# Patient Record
Sex: Female | Born: 1970 | Race: White | Hispanic: No | Marital: Married | Smoking: Never smoker
Health system: Southern US, Community
[De-identification: ages and names within clinical notes are randomized; demographics above are authoritative.]

## PROBLEM LIST (undated history)

## (undated) DIAGNOSIS — M25559 Pain in unspecified hip: Secondary | ICD-10-CM

## (undated) DIAGNOSIS — M8920 Other disorders of bone development and growth, unspecified site: Secondary | ICD-10-CM

## (undated) DIAGNOSIS — N852 Hypertrophy of uterus: Secondary | ICD-10-CM

## (undated) DIAGNOSIS — E785 Hyperlipidemia, unspecified: Secondary | ICD-10-CM

## (undated) DIAGNOSIS — Z90710 Acquired absence of both cervix and uterus: Secondary | ICD-10-CM

## (undated) DIAGNOSIS — G43909 Migraine, unspecified, not intractable, without status migrainosus: Secondary | ICD-10-CM

## (undated) HISTORY — DX: Other disorders of bone development and growth, unspecified site: M89.20

## (undated) HISTORY — DX: Acquired absence of both cervix and uterus: Z90.710

## (undated) HISTORY — PX: APPENDECTOMY: SHX54

## (undated) HISTORY — DX: Hyperlipidemia, unspecified: E78.5

## (undated) HISTORY — PX: AUGMENTATION MAMMAPLASTY: SUR837

## (undated) HISTORY — DX: Migraine, unspecified, not intractable, without status migrainosus: G43.909

## (undated) HISTORY — PX: BREAST SURGERY: SHX581

## (undated) HISTORY — DX: Pain in unspecified hip: M25.559

## (undated) HISTORY — DX: Hypertrophy of uterus: N85.2

## (undated) HISTORY — PX: PLACEMENT OF BREAST IMPLANTS: SHX6334

---

## 2004-06-26 ENCOUNTER — Ambulatory Visit: Payer: Self-pay

## 2004-11-19 ENCOUNTER — Observation Stay: Payer: Self-pay | Admitting: Unknown Physician Specialty

## 2004-11-21 ENCOUNTER — Observation Stay: Payer: Self-pay

## 2004-11-24 ENCOUNTER — Observation Stay: Payer: Self-pay | Admitting: Unknown Physician Specialty

## 2004-12-01 ENCOUNTER — Observation Stay: Payer: Self-pay

## 2004-12-02 ENCOUNTER — Inpatient Hospital Stay: Payer: Self-pay

## 2006-08-10 ENCOUNTER — Emergency Department (HOSPITAL_COMMUNITY): Admission: EM | Admit: 2006-08-10 | Discharge: 2006-08-10 | Payer: Self-pay | Admitting: Emergency Medicine

## 2008-03-28 ENCOUNTER — Emergency Department: Payer: Self-pay | Admitting: Emergency Medicine

## 2008-03-30 ENCOUNTER — Emergency Department: Payer: Self-pay | Admitting: Internal Medicine

## 2008-10-10 ENCOUNTER — Emergency Department: Payer: Self-pay | Admitting: Emergency Medicine

## 2008-12-19 ENCOUNTER — Emergency Department: Payer: Self-pay | Admitting: Unknown Physician Specialty

## 2009-01-22 ENCOUNTER — Emergency Department: Payer: Self-pay | Admitting: Emergency Medicine

## 2010-01-12 ENCOUNTER — Inpatient Hospital Stay: Payer: Self-pay | Admitting: Internal Medicine

## 2010-06-10 ENCOUNTER — Emergency Department: Payer: Self-pay | Admitting: Emergency Medicine

## 2011-09-05 ENCOUNTER — Emergency Department: Payer: Self-pay | Admitting: Emergency Medicine

## 2012-10-28 LAB — HM PAP SMEAR

## 2013-01-28 ENCOUNTER — Emergency Department: Payer: Self-pay | Admitting: Unknown Physician Specialty

## 2013-01-28 LAB — CBC
Platelet: 224 10*3/uL (ref 150–440)
RBC: 3.85 10*6/uL (ref 3.80–5.20)
RDW: 13.1 % (ref 11.5–14.5)

## 2013-01-28 LAB — BASIC METABOLIC PANEL
Chloride: 109 mmol/L — ABNORMAL HIGH (ref 98–107)
EGFR (African American): >60
Osmolality: 280 (ref 275–301)
Potassium: 4.1 mmol/L (ref 3.5–5.1)
Sodium: 141 mmol/L (ref 136–145)

## 2013-04-18 ENCOUNTER — Emergency Department: Payer: Self-pay | Admitting: Emergency Medicine

## 2013-12-21 ENCOUNTER — Emergency Department: Payer: Self-pay | Admitting: Emergency Medicine

## 2014-08-26 ENCOUNTER — Emergency Department: Payer: Self-pay | Admitting: Internal Medicine

## 2014-08-26 LAB — URINALYSIS, COMPLETE
BILIRUBIN, UR: NEGATIVE
BLOOD: NEGATIVE
Bacteria: NONE SEEN
GLUCOSE, UR: NEGATIVE mg/dL (ref 0–75)
KETONE: NEGATIVE
LEUKOCYTE ESTERASE: NEGATIVE
Nitrite: NEGATIVE
Ph: 5 (ref 4.5–8.0)
Protein: NEGATIVE
RBC, UR: NONE SEEN /HPF (ref 0–5)
Specific Gravity: 1.003 (ref 1.003–1.030)
Squamous Epithelial: 8
WBC UR: NONE SEEN /HPF (ref 0–5)

## 2016-05-07 ENCOUNTER — Encounter: Payer: Self-pay | Admitting: Family Medicine

## 2016-05-07 ENCOUNTER — Ambulatory Visit (INDEPENDENT_AMBULATORY_CARE_PROVIDER_SITE_OTHER): Payer: Medicaid Other | Admitting: Family Medicine

## 2016-05-07 VITALS — BP 113/69 | HR 64 | Temp 98.7°F | Ht 66.0 in | Wt 142.0 lb

## 2016-05-07 DIAGNOSIS — J029 Acute pharyngitis, unspecified: Secondary | ICD-10-CM | POA: Diagnosis not present

## 2016-05-07 DIAGNOSIS — M25551 Pain in right hip: Secondary | ICD-10-CM

## 2016-05-07 DIAGNOSIS — M25552 Pain in left hip: Secondary | ICD-10-CM

## 2016-05-07 DIAGNOSIS — N946 Dysmenorrhea, unspecified: Secondary | ICD-10-CM

## 2016-05-07 MED ORDER — DICLOFENAC SODIUM 1 % TD GEL: 4.0000 g | Freq: Four times a day (QID) | TRANSDERMAL | 3 refills | Status: DC

## 2016-05-07 MED ORDER — LIDOCAINE VISCOUS 2 % MT SOLN: 5.0000 mL | OROMUCOSAL | 0 refills | Status: DC | PRN

## 2016-05-07 NOTE — Progress Notes (Signed)
   BP 113/69   Pulse 64   Temp 98.7 F (37.1 C)   Ht 5\' 6"  (1.676 m)   Wt 142 lb (64.4 kg)   LMP 04/23/2016 (Approximate)   SpO2 100%   BMI 22.92 kg/m    Subjective:    [name], female    DOB: 08/29/1970, 45 y.o.   MRN: [id number]  HPI: Beverly Haley is a 45 y.o. female  Chief Complaint  Patient presents with  . Sore Throat    x 2-3 days, worse at night, feels like it's been cut, hurts to swallow/talk/eat.    Patient presents with 3 day history of severe sore throat and painful swallowing. Has not been taking anything for symptoms.   Relevant past medical, surgical, family and social history reviewed and updated as indicated. Interim medical history since our last visit reviewed. Allergies and medications reviewed and updated.  Review of Systems  Per HPI unless specifically indicated above     Objective:    BP 113/69   Pulse 64   Temp 98.7 F (37.1 C)   Ht 5\' 6"  (1.676 m)   Wt 142 lb (64.4 kg)   LMP 04/23/2016 (Approximate)   SpO2 100%   BMI 22.92 kg/m   Wt Readings from Last 3 Encounters:  05/07/16 142 lb (64.4 kg)  08/11/14 135 lb (61.2 kg)    Physical Exam  Results for orders placed or performed in visit on 05/07/16  HM PAP SMEAR  Result Value Ref Range   HM Pap smear per PP       Assessment & Plan:   Problem List Items Addressed This Visit    None    Visit Diagnoses    Sore throat    -  Primary   Relevant Orders   Rapid strep screen (not at [facility])       Follow up plan: No Follow-up on file.

## 2016-05-07 NOTE — Patient Instructions (Signed)
Follow up as needed

## 2016-05-08 ENCOUNTER — Telehealth: Payer: Self-pay | Admitting: Family Medicine

## 2016-05-08 ENCOUNTER — Other Ambulatory Visit: Payer: Self-pay | Admitting: Family Medicine

## 2016-05-08 ENCOUNTER — Encounter: Payer: Self-pay | Admitting: Family Medicine

## 2016-05-08 MED ORDER — ONDANSETRON HCL 4 MG PO TABS: 4.0000 mg | ORAL_TABLET | Freq: Three times a day (TID) | ORAL | 0 refills | Status: DC | PRN

## 2016-05-08 NOTE — Telephone Encounter (Signed)
Routing to provider  

## 2016-05-08 NOTE — Telephone Encounter (Signed)
Responded via patient mychart message.

## 2016-05-08 NOTE — Telephone Encounter (Signed)
Pt husband also called stating his concerns about his wife. We gave him the same information we gave his wife and told him the results would be in in the morning.

## 2016-05-08 NOTE — Telephone Encounter (Signed)
Called pt back told her what rachel had said. She called her in a nausea medicine and advised her to tylenol or ibuprofen for the pain. That we would contact her with her lab results tomorrow.

## 2016-05-09 ENCOUNTER — Telehealth: Payer: Self-pay | Admitting: Family Medicine

## 2016-05-09 ENCOUNTER — Encounter: Payer: Self-pay | Admitting: Family Medicine

## 2016-05-10 LAB — RAPID STREP SCREEN (MED CTR MEBANE ONLY): Strep Gp A Ag, IA W/Reflex: NEGATIVE

## 2016-05-10 LAB — CULTURE, GROUP A STREP: STREP A CULTURE: NEGATIVE

## 2016-05-12 ENCOUNTER — Encounter: Payer: Self-pay | Admitting: Family Medicine

## 2016-05-15 ENCOUNTER — Ambulatory Visit (INDEPENDENT_AMBULATORY_CARE_PROVIDER_SITE_OTHER): Payer: Medicaid Other | Admitting: Obstetrics and Gynecology

## 2016-05-15 ENCOUNTER — Encounter: Payer: Self-pay | Admitting: Obstetrics and Gynecology

## 2016-05-15 VITALS — BP 116/53 | HR 76 | Ht 66.0 in | Wt 138.2 lb

## 2016-05-15 DIAGNOSIS — N946 Dysmenorrhea, unspecified: Secondary | ICD-10-CM

## 2016-05-15 DIAGNOSIS — N920 Excessive and frequent menstruation with regular cycle: Secondary | ICD-10-CM | POA: Diagnosis not present

## 2016-05-15 DIAGNOSIS — N852 Hypertrophy of uterus: Secondary | ICD-10-CM

## 2016-05-15 HISTORY — DX: Hypertrophy of uterus: N85.2

## 2016-05-15 NOTE — Patient Instructions (Signed)
1. Ultrasound is scheduled 2. Return in 1 week after ultrasound for follow-up  Levonorgestrel intrauterine device (IUD) What is this medicine? LEVONORGESTREL IUD (LEE voe nor jes trel) is a contraceptive (birth control) device. The device is placed inside the uterus by a healthcare professional. It is used to prevent pregnancy and can also be used to treat heavy bleeding that occurs during your period. Depending on the device, it can be used for 3 to 5 years. This medicine may be used for other purposes; ask your health care provider or pharmacist if you have questions. What should I tell my health care provider before I take this medicine? They need to know if you have any of these conditions: -abnormal Pap smear -cancer of the breast, uterus, or cervix -diabetes -endometritis -genital or pelvic infection now or in the past -have more than one sexual partner or your partner has more than one partner -heart disease -history of an ectopic or tubal pregnancy -immune system problems -IUD in place -liver disease or tumor -problems with blood clots or take blood-thinners -use intravenous drugs -uterus of unusual shape -vaginal bleeding that has not been explained -an unusual or allergic reaction to levonorgestrel, other hormones, silicone, or polyethylene, medicines, foods, dyes, or preservatives -pregnant or trying to get pregnant -breast-feeding How should I use this medicine? This device is placed inside the uterus by a health care professional. Talk to your pediatrician regarding the use of this medicine in children. Special care may be needed. Overdosage: If you think you have taken too much of this medicine contact a poison control center or emergency room at once. NOTE: This medicine is only for you. Do not share this medicine with others. What if I miss a dose? This does not apply. What may interact with this medicine? Do not take this medicine with any of the following  medications: -amprenavir -bosentan -fosamprenavir This medicine may also interact with the following medications: -aprepitant -barbiturate medicines for inducing sleep or treating seizures -bexarotene -griseofulvin -medicines to treat seizures like carbamazepine, ethotoin, felbamate, oxcarbazepine, phenytoin, topiramate -modafinil -pioglitazone -rifabutin -rifampin -rifapentine -some medicines to treat HIV infection like atazanavir, indinavir, lopinavir, nelfinavir, tipranavir, ritonavir -St. John's wort -warfarin This list may not describe all possible interactions. Give your health care provider a list of all the medicines, herbs, non-prescription drugs, or dietary supplements you use. Also tell them if you smoke, drink alcohol, or use illegal drugs. Some items may interact with your medicine. What should I watch for while using this medicine? Visit your doctor or health care professional for regular check ups. See your doctor if you or your partner has sexual contact with others, becomes HIV positive, or gets a sexual transmitted disease. This product does not protect you against HIV infection (AIDS) or other sexually transmitted diseases. You can check the placement of the IUD yourself by reaching up to the top of your vagina with clean fingers to feel the threads. Do not pull on the threads. It is a good habit to check placement after each menstrual period. Call your doctor right away if you feel more of the IUD than just the threads or if you cannot feel the threads at all. The IUD may come out by itself. You may become pregnant if the device comes out. If you notice that the IUD has come out use a backup birth control method like condoms and call your health care provider. Using tampons will not change the position of the IUD and are okay to  use during your period. What side effects may I notice from receiving this medicine? Side effects that you should report to your doctor or  health care professional as soon as possible: -allergic reactions like skin rash, itching or hives, swelling of the face, lips, or tongue -fever, flu-like symptoms -genital sores -high blood pressure -no menstrual period for 6 weeks during use -pain, swelling, warmth in the leg -pelvic pain or tenderness -severe or sudden headache -signs of pregnancy -stomach cramping -sudden shortness of breath -trouble with balance, talking, or walking -unusual vaginal bleeding, discharge -yellowing of the eyes or skin Side effects that usually do not require medical attention (report to your doctor or health care professional if they continue or are bothersome): -acne -breast pain -change in sex drive or performance -changes in weight -cramping, dizziness, or faintness while the device is being inserted -headache -irregular menstrual bleeding within first 3 to 6 months of use -nausea This list may not describe all possible side effects. Call your doctor for medical advice about side effects. You may report side effects to FDA at 1-800-FDA-1088. Where should I keep my medicine? This does not apply. NOTE: This sheet is a summary. It may not cover all possible information. If you have questions about this medicine, talk to your doctor, pharmacist, or health care provider.    2016, Elsevier/Gold Standard. (2011-08-21 13:54:04)     Endometriosis Endometriosis is a condition in which the tissue that lines the uterus (endometrium) grows outside of its normal location. The tissue may grow in many locations close to the uterus, but it commonly grows on the ovaries, fallopian tubes, vagina, or bowel. Because the uterus expels, or sheds, its lining every menstrual cycle, there is bleeding wherever the endometrial tissue is located. This can cause pain because blood is irritating to tissues not normally exposed to it.  CAUSES  The cause of endometriosis is not known.  SIGNS AND SYMPTOMS  Often, there  are no symptoms. When symptoms are present, they can vary with the location of the displaced tissue. Various symptoms can occur at different times. Although symptoms occur mainly during a woman's menstrual period, they can also occur midcycle and usually stop with menopause. Some people may go months with no symptoms at all. Symptoms may include:   Back or abdominal pain.   Heavier bleeding during periods.   Pain during intercourse.   Painful bowel movements.   Infertility. DIAGNOSIS  Your health care provider will do a physical exam and ask about your symptoms. Various tests may be done, such as:   Blood tests and urine tests. These are done to help rule out other problems.   Ultrasound. This test is done to look for abnormal tissue.   An X-ray of the lower bowel (barium enema).  Laparoscopy. In this procedure, a thin, lighted tube with a tiny camera on the end (laparoscope) is inserted into your abdomen. This helps your health care provider look for abnormal tissue to confirm the diagnosis. The health care provider may also remove a small piece of tissue (biopsy) from any abnormal tissue found. This tissue sample can then be sent to a lab so it can be looked at under a microscope. TREATMENT  Treatment will vary and may include:   Medicines to relieve pain. Nonsteroidal anti-inflammatory drugs (NSAIDs) are a type of pain medicine that can help to relieve the pain caused by endometriosis.  Hormonal therapy. When using hormonal therapy, periods are eliminated. This eliminates the monthly exposure to blood  by the displaced endometrial tissue.   Surgery. Surgery may sometimes be done to remove the abnormal endometrial tissue. In severe cases, surgery may be done to remove the fallopian tubes, uterus, and ovaries (hysterectomy). HOME CARE INSTRUCTIONS   Take all medicines as directed by your health care provider. Do not take aspirin because it may increase bleeding when you are  not on hormonal therapy.   Avoid activities that produce pain, including sexual activity. SEEK MEDICAL CARE IF:  You have pelvic pain before, after, or during your periods.  You have pelvic pain between periods that gets worse during your period.  You have pelvic pain during or after sex.  You have pelvic pain with bowel movements or urination, especially during your period.  You have problems getting pregnant.  You have a fever. SEEK IMMEDIATE MEDICAL CARE IF:   Your pain is severe and is not responding to pain medicine.   You have severe nausea and vomiting, or you cannot keep foods down.   You have pain that is limited to the right lower part of your abdomen.   You have swelling or increasing pain in your abdomen.   You see blood in your stool.  MAKE SURE YOU:   Understand these instructions.  Will watch your condition.  Will get help right away if you are not doing well or get worse.   This information is not intended to replace advice given to you by your health care provider. Make sure you discuss any questions you have with your health care provider.   Document Released: 07/18/2000 Document Revised: 08/11/2014 Document Reviewed: 03/18/2013 Elsevier Interactive Patient Education 2016 Elsevier Inc   . Diagnostic Laparoscopy A diagnostic laparoscopy is a procedure to diagnose diseases in the abdomen. During the procedure, a thin, lighted, pencil-sized instrument called a laparoscope is inserted into the abdomen through an incision. The laparoscope allows your health care provider to look at the organs inside your body. LET [facility] CARE PROVIDER KNOW ABOUT:  Any allergies you have.  All medicines you are taking, including vitamins, herbs, eye drops, creams, and over-the-counter medicines.  Previous problems you or members of your family have had with the use of anesthetics.  Any blood disorders you have.  Previous surgeries you have had.  Medical  conditions you have. RISKS AND COMPLICATIONS  Generally, this is a safe procedure. However, problems can occur, which may include:  Infection.  Bleeding.  Damage to other organs.  Allergic reaction to the anesthetics used during the procedure. BEFORE THE PROCEDURE  Do not eat or drink anything after midnight on the night before the procedure or as directed by your health care provider.  Ask your health care provider about:  Changing or stopping your regular medicines.  Taking medicines such as aspirin and ibuprofen. These medicines can thin your blood. Do not take these medicines before your procedure if your health care provider instructs you not to.  Plan to have someone take you home after the procedure. PROCEDURE  You may be given a medicine to help you relax (sedative).  You will be given a medicine to make you sleep (general anesthetic).  Your abdomen will be inflated with a gas. This will make your organs easier to see.  Small incisions will be made in your abdomen.  A laparoscope and other small instruments will be inserted into the abdomen through the incisions.  A tissue sample may be removed from an organ in the abdomen for examination.  The instruments  will be removed from the abdomen.  The gas will be released.  The incisions will be closed with stitches (sutures). AFTER THE PROCEDURE  Your blood pressure, heart rate, breathing rate, and blood oxygen level will be monitored often until the medicines you were given have worn off.   This information is not intended to replace advice given to you by your health care provider. Make sure you discuss any questions you have with your health care provider.   Document Released: 10/27/2000 Document Revised: 04/11/2015 Document Reviewed: 03/03/2014 Elsevier Interactive Patient Education [employer]  3.  Information on laparoscopy, endometriosis and Mirena IUD is given

## 2016-05-15 NOTE — Progress Notes (Signed)
GYN ENCOUNTER NOTE  Subjective:       Beverly Haley is a 45 y.o. [id number] female is here for gynecologic evaluation of the following issues:  1. Menorrhagia 2. Dysmenorrhea  45 year old married white female para 3003, using vasectomy for contraception, [facility] in [facility] of menorrhagia. Patient is interested in possible endometrial ablation  Past obstetric history: Para 3003, spontaneous vaginal delivery 3, largest infant 8 lbs. 9 oz.  Past gynecologic history:  Menarche: Age 45 Intervals: Monthly Duration: 7-8 days, heavy Lifelong history of severe dysmenorrhea requiring Midol, nonsteroidal anti-inflammatory medication and heating pad to help with pain management; has missed school in the past because of severe cramps Dysmenorrhea is notable for midline and bilateral adnexal cramping right greater than left, associated with low back pain with radiation into the buttocks and thighs; birth control pills never really helped her condition when she was younger; Depo-Provera help the cramps somewhat but did not cause amenorrhea; patient denies deep thrusting dyspareunia Several sisters also with history of severe menstrual cramps without a diagnosis of endometriosis    Bowel function is normal although she does experience some irritability around her menses Bladder function is normal although she does note some urinary frequency and urgency around her menses   Gynecologic History Patient's last menstrual period was 04/23/2016 (approximate). Contraception: vasectomy Last Pap: 2014 negative  Obstetric History OB History  Gravida Para Term Preterm AB Living  3 3 3     3   SAB TAB Ectopic Multiple Live Births          3    # Outcome Date GA Lbr Len/2nd Weight Sex Delivery Anes PTL Lv  3 Term 2006   7 lb 9.6 oz (3.447 kg) F Vag-Spont   LIV  2 Term 1999   8 lb 14.4 oz (4.037 kg) M Vag-Spont   LIV  1 Term 1995   7 lb 3.2 oz (3.266 kg) F  Vag-Spont   LIV      Past Medical History:  Diagnosis Date  . Hip pain   . Hyperlipidemia   . Migraine   . 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 APPENDECTOMY    . PLACEMENT OF BREAST IMPLANTS      Current Outpatient Prescriptions on File Prior to Visit  Medication Sig Dispense Refill  . diclofenac sodium (VOLTAREN) 1 % GEL Apply 4 g topically 4 (four) times daily. 1 Tube 3   No current facility-administered medications on file prior to visit.     No Known Allergies  Social History   Social History  . Marital status: Married    Spouse name: N/A  . Number of children: N/A  . Years of education: N/A   Occupational History  . Not on file.   Social History Main Topics  . Smoking status: Never Smoker  . Smokeless tobacco: Never Used  . Alcohol use 21.0 oz/week    35 Cans of beer per week     Comment: 6-7 beers a night  . Drug use: No  . Sexual activity: Yes    Birth control/ protection: Surgical     Comment: Spouse had vasectomy   Other Topics Concern  . Not on file   Social History Narrative  . No narrative on file    Family History  Problem Relation Age of Onset  .  Migraines Mother   . Hyperlipidemia Father   . Hypertension Father   . Breast cancer Neg Hx   . Ovarian cancer Neg Hx   . Colon cancer Neg Hx   . Diabetes Neg Hx     The following portions of the patient's history were reviewed and updated as appropriate: allergies, current medications, past family history, past medical history, past social history, past surgical history and problem list.  Review of Systems Review of Systems -per history of present illness Objective:   BP (!) 116/53   Pulse 76   Ht 5\' 6"  (1.676 m)   Wt 138 lb 3.2 oz (62.7 kg)   LMP 04/23/2016 (Approximate)   BMI 22.31 kg/m  CONSTITUTIONAL: Well-developed, well-nourished female in no acute distress.  HENT:   Normocephalic, atraumatic.  NECK: Normal range of motion, supple, no masses.  Normal thyroid.  SKIN: Skin is warm and dry. No rash noted. Not diaphoretic. No erythema. No pallor. NEUROLGIC: Alert and oriented to person, place, and time. PSYCHIATRIC: Normal mood and affect. Normal behavior. Normal judgment and thought content. CARDIOVASCULAR:Not Examined RESPIRATORY: Not Examined BREASTS: Not Examined Back: No CVA tenderness ABDOMEN: Soft, non distended; Non tender.  No Organomegaly. PELVIC:  External Genitalia: Normal  BUS: Normal  Vagina: Normal  Cervix: Normal; parous; 1/4 cervical motion tenderness  Uterus: Globular 10 week size, retroverted, 2/4 tender, shape,consistency, mobile  Adnexa: Nonpalpable; mild tenderness bilaterally 1/4  RV: Normal external exam  Bladder: Nontender MUSCULOSKELETAL: Normal range of motion. No tenderness.  No cyanosis, clubbing, or edema.     Assessment:   1. Menorrhagia with regular cycle - US Pelvis Complete; Future - US Transvaginal Non-OB; Future  2. Dysmenorrhea - US Pelvis Complete; Future - US Transvaginal Non-OB; Future  3. Bulky or enlarged uterus - US Pelvis Complete; Future - US Transvaginal Non-OB; Future  Do not feel that endometrial ablation is the best option for this patient. Muscular consider endometriosis as the underlying problem. If present, we will likely recommend hysterectomy (vaginal) or laparoscopic-assisted  Plan:   1. Pelvic ultrasound 2. Return in 2 weeks for follow-up and further management planning 3. Consider laparoscopy with peritoneal biopsies to confirm endometriosis 4. Consider Mirena IUD is a nonsurgical option to try to manage her heavy menses and dysmenorrhea  Herold Harms, MD  Note: This dictation was prepared with Dragon dictation along with smaller phrase technology. Any transcriptional errors that result from this process are unintentional.

## 2016-05-22 ENCOUNTER — Ambulatory Visit (INDEPENDENT_AMBULATORY_CARE_PROVIDER_SITE_OTHER): Payer: Medicaid Other

## 2016-05-22 DIAGNOSIS — N852 Hypertrophy of uterus: Secondary | ICD-10-CM

## 2016-05-22 DIAGNOSIS — N946 Dysmenorrhea, unspecified: Secondary | ICD-10-CM | POA: Diagnosis not present

## 2016-05-22 DIAGNOSIS — N920 Excessive and frequent menstruation with regular cycle: Secondary | ICD-10-CM

## 2016-05-29 ENCOUNTER — Ambulatory Visit (INDEPENDENT_AMBULATORY_CARE_PROVIDER_SITE_OTHER): Payer: Medicaid Other | Admitting: Obstetrics and Gynecology

## 2016-05-29 VITALS — BP 113/58 | HR 72 | Ht 66.0 in | Wt 138.4 lb

## 2016-05-29 DIAGNOSIS — N852 Hypertrophy of uterus: Secondary | ICD-10-CM | POA: Diagnosis not present

## 2016-05-29 DIAGNOSIS — N920 Excessive and frequent menstruation with regular cycle: Secondary | ICD-10-CM | POA: Diagnosis not present

## 2016-05-29 DIAGNOSIS — N946 Dysmenorrhea, unspecified: Secondary | ICD-10-CM | POA: Diagnosis not present

## 2016-05-29 NOTE — Patient Instructions (Signed)
1. Laparoscopy with peritoneal biopsies scheduled 2. If everything is totally normal on laparoscopy, we will also proceed with hysteroscopy/D&C with NovaSure endometrial ablation 3. Return for preoperative appointment the week before surgery  Diagnostic Laparoscopy A diagnostic laparoscopy is a procedure to diagnose diseases in the abdomen. During the procedure, a thin, lighted, pencil-sized instrument called a laparoscope is inserted into the abdomen through an incision. The laparoscope allows your health care provider to look at the organs inside your body. LET [facility] CARE PROVIDER KNOW ABOUT:  Any allergies you have.  All medicines you are taking, including vitamins, herbs, eye drops, creams, and over-the-counter medicines.  Previous problems you or members of your family have had with the use of anesthetics.  Any blood disorders you have.  Previous surgeries you have had.  Medical conditions you have. RISKS AND COMPLICATIONS  Generally, this is a safe procedure. However, problems can occur, which may include:  Infection.  Bleeding.  Damage to other organs.  Allergic reaction to the anesthetics used during the procedure. BEFORE THE PROCEDURE  Do not eat or drink anything after midnight on the night before the procedure or as directed by your health care provider.  Ask your health care provider about:  Changing or stopping your regular medicines.  Taking medicines such as aspirin and ibuprofen. These medicines can thin your blood. Do not take these medicines before your procedure if your health care provider instructs you not to.  Plan to have someone take you home after the procedure. PROCEDURE  You may be given a medicine to help you relax (sedative).  You will be given a medicine to make you sleep (general anesthetic).  Your abdomen will be inflated with a gas. This will make your organs easier to see.  Small incisions will be made in your abdomen.  A  laparoscope and other small instruments will be inserted into the abdomen through the incisions.  A tissue sample may be removed from an organ in the abdomen for examination.  The instruments will be removed from the abdomen.  The gas will be released.  The incisions will be closed with stitches (sutures). AFTER THE PROCEDURE  Your blood pressure, heart rate, breathing rate, and blood oxygen level will be monitored often until the medicines you were given have worn off.   This information is not intended to replace advice given to you by your health care provider. Make sure you discuss any questions you have with your health care provider.   Document Released: 10/27/2000 Document Revised: 04/11/2015 Document Reviewed: 03/03/2014 Elsevier Interactive Patient Education 2016 Elsevier Inc.    Endometrial Ablation Endometrial ablation removes the lining of the uterus (endometrium). It is usually a same-day, outpatient treatment. Ablation helps avoid major surgery, such as surgery to remove the cervix and uterus (hysterectomy). After endometrial ablation, you will have little or no menstrual bleeding and may not be able to have children. However, if you are premenopausal, you will need to use a reliable method of birth control following the procedure because of the small chance that pregnancy can occur. There are different reasons to have this procedure. These reasons include:  Heavy periods.  Bleeding that is causing anemia.  Irregular bleeding.  Bleeding fibroids on the lining inside the uterus if they are smaller than 3 centimeters. This procedure may not be possible for you if:   You want to have children in the future.   You have severe cramps with your menstrual period.   You have  precancerous or cancerous cells in your uterus.   You were recently pregnant.   You have gone through menopause.   You have had major surgery on your uterus, resulting in thinning of the  uterine wall. Surgeries may include:  The removal of one or more uterine fibroids (myomectomy).  A cesarean section with a classic (vertical) incision on your uterus. Ask your health care provider what type of cesarean you had. Sometimes the scar on your skin is different than the scar on your uterus. Even if you have had surgery on your uterus, certain types of ablation may still be safe for you. Talk with your health care provider. LET [facility] PROVIDER KNOW ABOUT:  Any allergies you have.  All medicines you are taking, including vitamins, herbs, eye drops, creams, and over-the-counter medicines.  Previous problems you or members of your family have had with the use of anesthetics.  Any blood disorders you have.  Previous surgeries you have had.  Medical conditions you have. RISKS AND COMPLICATIONS  Generally, this is a safe procedure. However, as with any procedure, complications can occur. Possible complications include:  Perforation of the uterus.  Bleeding.  Infection of the uterus, bladder, or vagina.  Injury to surrounding organs.  An air bubble to the lung (air embolus).  Pregnancy following the procedure.  Failure of the procedure to help the problem, requiring hysterectomy.  Decreased ability to diagnose cancer in the lining of the uterus. BEFORE THE PROCEDURE  The lining of the uterus must be tested to make sure there is no pre-cancerous or cancer cells present.  An ultrasound may be performed to look at the size of the uterus and to check for abnormalities.  Medicines may be given to thin the lining of the uterus. PROCEDURE  During the procedure, your health care provider will use a tool called a resectoscope to help see inside your uterus. There are different ways to remove the lining of your uterus.   Radiofrequency - This method uses a radiofrequency-alternating electric current to remove the lining of the uterus.  Cryotherapy - This method  uses extreme cold to freeze the lining of the uterus.  Heated-Free Liquid - This method uses heated salt (saline) solution to remove the lining of the uterus.  Microwave - This method uses high-energy microwaves to heat up the lining of the uterus to remove it.  Thermal balloon - This method involves inserting a catheter with a balloon tip into the uterus. The balloon tip is filled with heated fluid to remove the lining of the uterus. AFTER THE PROCEDURE  After your procedure, do not have sexual intercourse or insert anything into your vagina until permitted by your health care provider. After the procedure, you may experience:  Cramps.  Vaginal discharge.  Frequent urination.   This information is not intended to replace advice given to you by your health care provider. Make sure you discuss any questions you have with your health care provider.   Document Released: 05/30/2004 Document Revised: 04/11/2015 Document Reviewed: 12/22/2012 Elsevier Interactive Patient Education [employer]2016 Elsevier Inc.

## 2016-05-29 NOTE — Progress Notes (Signed)
Chief complaint: 1. Menorrhagia 2. Dysmenorrhea 3. Enlarged uterus  Patient presents for follow-up after ultrasound for further management planning. Ultrasound is normal.  Options of management were reviewed. Patient understands that I recommend laparoscopy with biopsies to rule out endometriosis before we consider performing endometrial ablation.  OBJECTIVE: BP (!) 113/58   Pulse 72   Ht 5\' 6"  (1.676 m)   Wt 138 lb 6.4 oz (62.8 kg)   LMP 05/26/2016 (Exact Date)   BMI 22.34 kg/m  Physical exam-deferred  ASSESSMENT: 1. Menorrhagia with regular cycles 2. Severe dysmenorrhea 3. Normal pelvic ultrasound with normal uterine measurements 4. Dispense endometriosis/adenomyosis as being the etiology to patient's symptoms; patient understands that if she has endometriosis, the best option for management would be hysterectomy rather than endometrial ablation 5. If laparoscopy is totally normal, we will then proceed at the same time to perform hysteroscopy/D&C with NovaSure endometrial ablation. If there are abnormal findings at laparoscopy, ablation will not be performed  A total of 15 minutes were spent face-to-face with the patient during this encounter and over half of that time dealt with counseling and coordination of care.  Herold HarmsMartin A Crews Mccollam, MD  Note: This dictation was prepared with Dragon dictation along with smaller phrase technology. Any transcriptional errors that result from this process are unintentional.

## 2016-06-12 ENCOUNTER — Encounter: Payer: Self-pay | Admitting: Obstetrics and Gynecology

## 2016-06-12 ENCOUNTER — Ambulatory Visit (INDEPENDENT_AMBULATORY_CARE_PROVIDER_SITE_OTHER): Payer: Medicaid Other | Admitting: Obstetrics and Gynecology

## 2016-06-12 ENCOUNTER — Encounter
Admission: RE | Admit: 2016-06-12 | Discharge: 2016-06-12 | Disposition: A | Discharge status: Home / Self Care | Payer: Medicaid Other | Source: Ambulatory Visit | Attending: Obstetrics and Gynecology | Admitting: Obstetrics and Gynecology

## 2016-06-12 VITALS — BP 94/63 | HR 69 | Wt 137.0 lb

## 2016-06-12 DIAGNOSIS — Z01812 Encounter for preprocedural laboratory examination: Secondary | ICD-10-CM | POA: Insufficient documentation

## 2016-06-12 DIAGNOSIS — Z01818 Encounter for other preprocedural examination: Secondary | ICD-10-CM

## 2016-06-12 DIAGNOSIS — N920 Excessive and frequent menstruation with regular cycle: Secondary | ICD-10-CM

## 2016-06-12 DIAGNOSIS — N946 Dysmenorrhea, unspecified: Secondary | ICD-10-CM

## 2016-06-12 DIAGNOSIS — N852 Hypertrophy of uterus: Secondary | ICD-10-CM

## 2016-06-12 LAB — CBC WITH DIFFERENTIAL/PLATELET
BASOS ABS: 0 10*3/uL (ref 0–0.1)
BASOS PCT: 0 %
EOS PCT: 3 %
Eosinophils Absolute: 0.1 10*3/uL (ref 0–0.7)
HEMATOCRIT: 37.2 % (ref 35.0–47.0)
Hemoglobin: 12.4 g/dL (ref 12.0–16.0)
LYMPHS PCT: 22 %
Lymphs Abs: 1.3 10*3/uL (ref 1.0–3.6)
MCH: 28.4 pg (ref 26.0–34.0)
MCHC: 33.3 g/dL (ref 32.0–36.0)
MCV: 85.3 fL (ref 80.0–100.0)
Monocytes Absolute: 0.4 10*3/uL (ref 0.2–0.9)
Monocytes Relative: 7 %
NEUTROS ABS: 3.9 10*3/uL (ref 1.4–6.5)
Neutrophils Relative %: 68 %
PLATELETS: 252 10*3/uL (ref 150–440)
RBC: 4.36 MIL/uL (ref 3.80–5.20)
RDW: 15.3 % — ABNORMAL HIGH (ref 11.5–14.5)
WBC: 5.8 10*3/uL (ref 3.6–11.0)

## 2016-06-12 LAB — TYPE AND SCREEN
ABO/RH(D): O POS
Antibody Screen: NEGATIVE

## 2016-06-12 LAB — RAPID HIV SCREEN (HIV 1/2 AB+AG)
HIV 1/2 ANTIBODIES: NONREACTIVE
HIV-1 P24 Antigen - HIV24: NONREACTIVE

## 2016-06-12 NOTE — Progress Notes (Signed)
Subjective: PREOPERATIVE HISTORY AND PHYSICAL      Date of surgery: 06/16/2016 Seizure: 1. Laparoscopy with peritoneal biopsies 2. Hysteroscopy/D&C with NovaSure endometrial ablation    Patient is a 45 y.o. G3P3003female scheduled for laparoscopy with peritoneal biopsies to rule out possible endometriosis. If no pathology is identified, patient will also have hysteroscopy/D&C with NovaSure endometrial ablation.   Past obstetric history: Para 3003, spontaneous vaginal delivery 3, largest infant 8 lbs. 9 oz.  Past gynecologic history:  Menarche: Age 12 Intervals: Monthly Duration: 7-8 days, heavy Lifelong history of severe dysmenorrhea requiring Midol, nonsteroidal anti-inflammatory medication and heating pad to help with pain management; has missed school in the past because of severe cramps Dysmenorrhea is notable for midline and bilateral adnexal cramping right greater than left, associated with low back pain with radiation into the buttocks and thighs; birth control pills never really helped her condition when she was younger; Depo-Provera help the cramps somewhat but did not cause amenorrhea; patient denies deep thrusting dyspareunia Several sisters also with history of severe menstrual cramps without a diagnosis of endometriosis    Bowel function is normal although she does experience some irritability around her menses Bladder function is normal although she does note some urinary frequency and urgency around her menses   Gynecologic History Patient's last menstrual period was 04/23/2016 (approximate). Contraception: vasectomy Last Pap: 2014 negative   Menstrual History: OB History    Gravida Para Term Preterm AB Living   3 3 3     3   SAB TAB Ectopic Multiple Live Births           3      Patient's last menstrual period was 05/26/2016 (exact date).    Past Medical History:  Diagnosis Date  . Hip pain   . Hyperlipidemia   . Migraine   . 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 APPENDECTOMY    . PLACEMENT OF BREAST IMPLANTS      OB History  Gravida Para Term Preterm AB Living  3 3 3     3  SAB TAB Ectopic Multiple Live Births          3    # Outcome Date GA Lbr Len/2nd Weight Sex Delivery Anes PTL Lv  3 Term 2006   7 lb 9.6 oz (3.447 kg) F Vag-Spont   LIV  2 Term 1999   8 lb 14.4 oz (4.037 kg) M Vag-Spont   LIV  1 Term 1995   7 lb 3.2 oz (3.266 kg) F Vag-Spont   LIV      Social History   Social History  . Marital status: Married    Spouse name: N/A  . Number of children: N/A  . Years of education: N/A   Social History Main Topics  . Smoking status: Never Smoker  . Smokeless tobacco: Never Used  . Alcohol use 21.0 oz/week    35 Cans of beer per week     Comment: 6-7 beers a night  . Drug use: No  . Sexual activity: Yes    Birth control/ protection: Surgical     Comment: Spouse had vasectomy   Other Topics Concern  . None   Social History Narrative  . None    Family History  Problem Relation Age of Onset  . Migraines Mother   . Hyperlipidemia Father   .   Hypertension Father   . Breast cancer Neg Hx   . Ovarian cancer Neg Hx   . Colon cancer Neg Hx   . Diabetes Neg Hx      (Not in a hospital admission)  No Known Allergies  Review of Systems Constitutional: No recent fever/chills/sweats Respiratory: No recent cough/bronchitis Cardiovascular: No chest pain Gastrointestinal: No recent nausea/vomiting/diarrhea Genitourinary: No UTI symptoms Hematologic/lymphatic:No history of coagulopathy or recent blood thinner use    Objective:    BP 94/63 (BP Location: Left Arm, Patient Position: Sitting, Cuff Size: Normal)   Pulse 69   Wt 137 lb (62.1 kg)   LMP 05/26/2016 (Exact Date)   BMI 22.11 kg/m   General:   Normal  Skin:   normal  HEENT:  Normal  Neck:  Supple without Adenopathy or Thyromegaly  Lungs:    Heart:              Breasts:   Abdomen:  Pelvis:  M/S   Extremeties:  Neuro:    clear to auscultation bilaterally   Normal without murmur   Not Examined   soft, non-tender; bowel sounds normal; no masses,  no organomegaly   Exam deferred to OR  No CVAT  Warm/Dry   Normal       05/15/2016 PELVIC:             External Genitalia: Normal             BUS: Normal             Vagina: Normal             Cervix: Normal; parous; 1/4 cervical motion tenderness             Uterus: Globular 10 week size, retroverted, 2/4 tender, shape,consistency, mobile             Adnexa: Nonpalpable; mild tenderness bilaterally 1/4             RV: Normal external exam             Bladder: Nontender  Assessment:     1. Menorrhagia with regular cycles 2. Severe dysmenorrhea 3. Normal pelvic ultrasound with normal uterine measurements 4. Endometriosis/adenomyosis may be the etiology to patient's symptoms; patient understands that if she has endometriosis, the best option for management would be hysterectomy rather than endometrial ablation 5. If laparoscopy is totally normal, we will then proceed at the same time to perform hysteroscopy/D&C with NovaSure endometrial ablation. If there are abnormal findings at laparoscopy, ablation will not be performed Plan:  1. Laparoscopy with peritoneal biopsies If laparoscopy is normal, we will then proceed with 2. Hysteroscopy/D&C with NovaSure endometrial ablation  Preop counseling: Patient is to undergo laparoscopy with peritoneal biopsies to rule out endometriosis. Should normal pelvic findings be identified, she will then undergo hysteroscopy/D&C with NovaSure endometrial ablation. The patient is understanding of the planned procedures and is aware of and is accepting of all risks which include but are not limited to bleeding, infection, pelvic organ injury with need for repair, blood clot disorders, anesthesia risks, etc. All questions have been answered.  Informed consent is given. Patient is ready and willing to proceed with surgery as scheduled.  Beverly Reaves A Sibley Rolison, MD  Note: This dictation was prepared with Dragon dictation along with smaller phrase technology. Any transcriptional errors that result from this process are unintentional.  

## 2016-06-12 NOTE — Patient Instructions (Signed)
1. Return in 1 week after surgery for postop check 

## 2016-06-12 NOTE — Patient Instructions (Signed)
  Your procedure is scheduled on: June 16, 2016 (Monday) Report to Same Day Surgery 2nd floor [facility] To find out your arrival time please call [phone] between 1PM - 3PM on June 13, 2016 (Friday)  Remember: Instructions that are not followed completely may result in serious medical risk, up to and including death, or upon the discretion of your surgeon and anesthesiologist your surgery may need to be rescheduled.    _x___ 1. Do not eat food or drink liquids after midnight. No gum chewing or hard candies.     __x__ 2. No Alcohol for 24 hours before or after surgery.   __x__3. No Smoking for 24 prior to surgery.   ____  4. Bring all medications with you on the day of surgery if instructed.    __x__ 5. Notify your doctor if there is any change in your medical condition     (cold, fever, infections).     Do not wear jewelry, make-up, hairpins, clips or nail polish.  Do not wear lotions, powders, or perfumes. You may wear deodorant.  Do not shave 48 hours prior to surgery. Men may shave face and neck.  Do not bring valuables to the hospital.    [facility] for any belongings or valuables.               Contacts, dentures or bridgework may not be worn into surgery.  Leave your suitcase in the car. After surgery it may be brought to your room.  For patients admitted to the hospital, discharge time is determined by your treatment team.   Patients discharged the day of surgery will not be allowed to drive home.    Please read over the following fact sheets that you were given:   [facility] Preparing for Surgery and or MRSA Information   __ Take these medicines the morning of surgery with A SIP OF WATER:    1.   2.  3.  4.  5.  6.  ____Fleets enema or Magnesium Citrate as directed.   _x___ Use CHG Soap or sage wipes as directed on instruction sheet   ____ Use inhalers on the day of surgery and bring to hospital day of surgery  ____ Stop  metformin 2 days prior to surgery    ____ Take 1/2 of usual insulin dose the night before surgery and none on the morning of           surgery.   _x__ Stop aspirin or coumadin, or plavix (NO ASPIRIN)  x__ Stop Anti-inflammatories such as Advil, Aleve, Ibuprofen, Motrin, Naproxen,          Naprosyn, Goodies powders or aspirin products. Ok to take Tylenol.   ____ Stop supplements until after surgery.    ____ Bring C-Pap to the hospital.

## 2016-06-12 NOTE — H&P (Signed)
Subjective: PREOPERATIVE HISTORY AND PHYSICAL      Date of surgery: 06/16/2016 Seizure: 1. Laparoscopy with peritoneal biopsies 2. Hysteroscopy/D&C with NovaSure endometrial ablation    Patient is a 45 y.o. G3P3003female scheduled for laparoscopy with peritoneal biopsies to rule out possible endometriosis. If no pathology is identified, patient will also have hysteroscopy/D&C with NovaSure endometrial ablation.   Past obstetric history: Para 3003, spontaneous vaginal delivery 3, largest infant 8 lbs. 9 oz.  Past gynecologic history:  Menarche: Age 12 Intervals: Monthly Duration: 7-8 days, heavy Lifelong history of severe dysmenorrhea requiring Midol, nonsteroidal anti-inflammatory medication and heating pad to help with pain management; has missed school in the past because of severe cramps Dysmenorrhea is notable for midline and bilateral adnexal cramping right greater than left, associated with low back pain with radiation into the buttocks and thighs; birth control pills never really helped her condition when she was younger; Depo-Provera help the cramps somewhat but did not cause amenorrhea; patient denies deep thrusting dyspareunia Several sisters also with history of severe menstrual cramps without a diagnosis of endometriosis    Bowel function is normal although she does experience some irritability around her menses Bladder function is normal although she does note some urinary frequency and urgency around her menses   Gynecologic History Patient's last menstrual period was 04/23/2016 (approximate). Contraception: vasectomy Last Pap: 2014 negative   Menstrual History: OB History    Gravida Para Term Preterm AB Living   3 3 3     3   SAB TAB Ectopic Multiple Live Births           3      Patient's last menstrual period was 05/26/2016 (exact date).    Past Medical History:  Diagnosis Date  . Hip pain   . Hyperlipidemia   . Migraine   . 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 APPENDECTOMY    . PLACEMENT OF BREAST IMPLANTS      OB History  Gravida Para Term Preterm AB Living  3 3 3     3  SAB TAB Ectopic Multiple Live Births          3    # Outcome Date GA Lbr Len/2nd Weight Sex Delivery Anes PTL Lv  3 Term 2006   7 lb 9.6 oz (3.447 kg) F Vag-Spont   LIV  2 Term 1999   8 lb 14.4 oz (4.037 kg) M Vag-Spont   LIV  1 Term 1995   7 lb 3.2 oz (3.266 kg) F Vag-Spont   LIV      Social History   Social History  . Marital status: Married    Spouse name: N/A  . Number of children: N/A  . Years of education: N/A   Social History Main Topics  . Smoking status: Never Smoker  . Smokeless tobacco: Never Used  . Alcohol use 21.0 oz/week    35 Cans of beer per week     Comment: 6-7 beers a night  . Drug use: No  . Sexual activity: Yes    Birth control/ protection: Surgical     Comment: Spouse had vasectomy   Other Topics Concern  . None   Social History Narrative  . None    Family History  Problem Relation Age of Onset  . Migraines Mother   . Hyperlipidemia Father   .   Hypertension Father   . Breast cancer Neg Hx   . Ovarian cancer Neg Hx   . Colon cancer Neg Hx   . Diabetes Neg Hx      (Not in a hospital admission)  No Known Allergies  Review of Systems Constitutional: No recent fever/chills/sweats Respiratory: No recent cough/bronchitis Cardiovascular: No chest pain Gastrointestinal: No recent nausea/vomiting/diarrhea Genitourinary: No UTI symptoms Hematologic/lymphatic:No history of coagulopathy or recent blood thinner use    Objective:    BP 94/63 (BP Location: Left Arm, Patient Position: Sitting, Cuff Size: Normal)   Pulse 69   Wt 137 lb (62.1 kg)   LMP 05/26/2016 (Exact Date)   BMI 22.11 kg/m   General:   Normal  Skin:   normal  HEENT:  Normal  Neck:  Supple without Adenopathy or Thyromegaly  Lungs:    Heart:              Breasts:   Abdomen:  Pelvis:  M/S   Extremeties:  Neuro:    clear to auscultation bilaterally   Normal without murmur   Not Examined   soft, non-tender; bowel sounds normal; no masses,  no organomegaly   Exam deferred to OR  No CVAT  Warm/Dry   Normal       05/15/2016 PELVIC:             External Genitalia: Normal             BUS: Normal             Vagina: Normal             Cervix: Normal; parous; 1/4 cervical motion tenderness             Uterus: Globular 10 week size, retroverted, 2/4 tender, shape,consistency, mobile             Adnexa: Nonpalpable; mild tenderness bilaterally 1/4             RV: Normal external exam             Bladder: Nontender  Assessment:     1. Menorrhagia with regular cycles 2. Severe dysmenorrhea 3. Normal pelvic ultrasound with normal uterine measurements 4. Endometriosis/adenomyosis may be the etiology to patient's symptoms; patient understands that if she has endometriosis, the best option for management would be hysterectomy rather than endometrial ablation 5. If laparoscopy is totally normal, we will then proceed at the same time to perform hysteroscopy/D&C with NovaSure endometrial ablation. If there are abnormal findings at laparoscopy, ablation will not be performed Plan:  1. Laparoscopy with peritoneal biopsies If laparoscopy is normal, we will then proceed with 2. Hysteroscopy/D&C with NovaSure endometrial ablation  Preop counseling: Patient is to undergo laparoscopy with peritoneal biopsies to rule out endometriosis. Should normal pelvic findings be identified, she will then undergo hysteroscopy/D&C with NovaSure endometrial ablation. The patient is understanding of the planned procedures and is aware of and is accepting of all risks which include but are not limited to bleeding, infection, pelvic organ injury with need for repair, blood clot disorders, anesthesia risks, etc. All questions have been answered.  Informed consent is given. Patient is ready and willing to proceed with surgery as scheduled.  Herold HarmsMartin A Sharryn Belding, MD  Note: This dictation was prepared with Dragon dictation along with smaller phrase technology. Any transcriptional errors that result from this process are unintentional.

## 2016-06-13 LAB — RPR: RPR Ser Ql: NONREACTIVE

## 2016-06-16 ENCOUNTER — Encounter: Payer: Self-pay | Admitting: *Deleted

## 2016-06-16 ENCOUNTER — Encounter
Admission: RE | Disposition: A | Discharge status: Home / Self Care | Payer: Self-pay | Source: Ambulatory Visit | Attending: Obstetrics and Gynecology

## 2016-06-16 ENCOUNTER — Ambulatory Visit
Admission: RE | Admit: 2016-06-16 | Discharge: 2016-06-16 | Disposition: A | Discharge status: Home / Self Care | Payer: Medicaid Other | Source: Ambulatory Visit | Attending: Obstetrics and Gynecology | Admitting: Obstetrics and Gynecology

## 2016-06-16 ENCOUNTER — Ambulatory Visit: Payer: Medicaid Other | Admitting: Anesthesiology

## 2016-06-16 DIAGNOSIS — N852 Hypertrophy of uterus: Secondary | ICD-10-CM | POA: Insufficient documentation

## 2016-06-16 DIAGNOSIS — N946 Dysmenorrhea, unspecified: Secondary | ICD-10-CM | POA: Insufficient documentation

## 2016-06-16 DIAGNOSIS — N803 Endometriosis of pelvic peritoneum: Secondary | ICD-10-CM | POA: Diagnosis not present

## 2016-06-16 DIAGNOSIS — N920 Excessive and frequent menstruation with regular cycle: Secondary | ICD-10-CM | POA: Insufficient documentation

## 2016-06-16 DIAGNOSIS — R51 Headache: Secondary | ICD-10-CM | POA: Diagnosis not present

## 2016-06-16 DIAGNOSIS — Z9889 Other specified postprocedural states: Secondary | ICD-10-CM

## 2016-06-16 DIAGNOSIS — Z90710 Acquired absence of both cervix and uterus: Secondary | ICD-10-CM

## 2016-06-16 HISTORY — PX: HYSTEROSCOPY WITH NOVASURE: SHX5574

## 2016-06-16 HISTORY — PX: LAPAROSCOPY: SHX197

## 2016-06-16 HISTORY — DX: Acquired absence of both cervix and uterus: Z90.710

## 2016-06-16 LAB — ABO/RH: ABO/RH(D): O POS

## 2016-06-16 SURGERY — LAPAROSCOPY, DIAGNOSTIC
Anesthesia: General | Wound class: Clean

## 2016-06-16 MED ORDER — FENTANYL CITRATE (PF) 100 MCG/2ML IJ SOLN
INTRAMUSCULAR | Status: AC
  Administered 2016-06-16: 25 ug via INTRAVENOUS
  Filled 2016-06-16: qty 2

## 2016-06-16 MED ORDER — NEOSTIGMINE METHYLSULFATE 10 MG/10ML IV SOLN
INTRAVENOUS | Status: DC | PRN
  Administered 2016-06-16: 3 mg via INTRAVENOUS

## 2016-06-16 MED ORDER — FAMOTIDINE 20 MG PO TABS
ORAL_TABLET | ORAL | Status: AC
  Administered 2016-06-16: 20 mg via ORAL
  Filled 2016-06-16: qty 1

## 2016-06-16 MED ORDER — LIDOCAINE HCL (CARDIAC) 20 MG/ML IV SOLN
INTRAVENOUS | Status: DC | PRN
  Administered 2016-06-16: 60 mg via INTRAVENOUS

## 2016-06-16 MED ORDER — FAMOTIDINE 20 MG PO TABS
20.0000 mg | ORAL_TABLET | Freq: Once | ORAL | Status: AC
  Administered 2016-06-16: 20 mg via ORAL

## 2016-06-16 MED ORDER — LACTATED RINGERS IV SOLN
INTRAVENOUS | Status: DC
  Administered 2016-06-16 (×2): via INTRAVENOUS

## 2016-06-16 MED ORDER — OXYCODONE HCL 5 MG PO TABS
ORAL_TABLET | ORAL | Status: AC
  Filled 2016-06-16: qty 1

## 2016-06-16 MED ORDER — PROMETHAZINE HCL 25 MG/ML IJ SOLN: 6.2500 mg | INTRAMUSCULAR | Status: DC | PRN

## 2016-06-16 MED ORDER — IBUPROFEN 800 MG PO TABS: 800.0000 mg | ORAL_TABLET | Freq: Three times a day (TID) | ORAL | 1 refills | Status: DC

## 2016-06-16 MED ORDER — ONDANSETRON HCL 4 MG/2ML IJ SOLN
INTRAMUSCULAR | Status: DC | PRN
  Administered 2016-06-16: 4 mg via INTRAVENOUS

## 2016-06-16 MED ORDER — FENTANYL CITRATE (PF) 100 MCG/2ML IJ SOLN
25.0000 ug | INTRAMUSCULAR | Status: AC | PRN
  Administered 2016-06-16 (×6): 25 ug via INTRAVENOUS

## 2016-06-16 MED ORDER — MEPERIDINE HCL 25 MG/ML IJ SOLN: 6.2500 mg | INTRAMUSCULAR | Status: DC | PRN

## 2016-06-16 MED ORDER — ROCURONIUM BROMIDE 100 MG/10ML IV SOLN
INTRAVENOUS | Status: DC | PRN
  Administered 2016-06-16: 50 mg via INTRAVENOUS

## 2016-06-16 MED ORDER — MIDAZOLAM HCL 2 MG/2ML IJ SOLN
INTRAMUSCULAR | Status: DC | PRN
  Administered 2016-06-16: 2 mg via INTRAVENOUS

## 2016-06-16 MED ORDER — OXYCODONE-ACETAMINOPHEN 5-325 MG PO TABS: 1.0000 | ORAL_TABLET | ORAL | 0 refills | Status: DC | PRN

## 2016-06-16 MED ORDER — OXYCODONE HCL 5 MG PO TABS
5.0000 mg | ORAL_TABLET | Freq: Once | ORAL | Status: AC | PRN
  Administered 2016-06-16: 5 mg via ORAL

## 2016-06-16 MED ORDER — DEXAMETHASONE SODIUM PHOSPHATE 10 MG/ML IJ SOLN
INTRAMUSCULAR | Status: DC | PRN
Start: 2016-06-16 — End: 2016-06-16
  Administered 2016-06-16: 5 mg via INTRAVENOUS

## 2016-06-16 MED ORDER — KETOROLAC TROMETHAMINE 30 MG/ML IJ SOLN
INTRAMUSCULAR | Status: DC | PRN
  Administered 2016-06-16: 30 mg via INTRAVENOUS

## 2016-06-16 MED ORDER — GLYCOPYRROLATE 0.2 MG/ML IJ SOLN
0.2000 mg | Freq: Once | INTRAMUSCULAR | Status: AC
  Administered 2016-06-16: 0.2 mg via INTRAVENOUS

## 2016-06-16 MED ORDER — FENTANYL CITRATE (PF) 100 MCG/2ML IJ SOLN
INTRAMUSCULAR | Status: DC | PRN
  Administered 2016-06-16 (×2): 50 ug via INTRAVENOUS
  Administered 2016-06-16: 25 ug via INTRAVENOUS
  Administered 2016-06-16: 50 ug via INTRAVENOUS
  Administered 2016-06-16: 25 ug via INTRAVENOUS

## 2016-06-16 MED ORDER — GLYCOPYRROLATE 0.2 MG/ML IJ SOLN
INTRAMUSCULAR | Status: DC | PRN
  Administered 2016-06-16: .4 mg via INTRAVENOUS

## 2016-06-16 MED ORDER — PROPOFOL 10 MG/ML IV BOLUS
INTRAVENOUS | Status: DC | PRN
Start: 2016-06-16 — End: 2016-06-16
  Administered 2016-06-16: 50 mg via INTRAVENOUS
  Administered 2016-06-16: 130 mg via INTRAVENOUS

## 2016-06-16 MED ORDER — GLYCOPYRROLATE 0.2 MG/ML IJ SOLN
INTRAMUSCULAR | Status: AC
  Administered 2016-06-16: 0.2 mg via INTRAVENOUS
  Filled 2016-06-16: qty 1

## 2016-06-16 MED ORDER — OXYCODONE HCL 5 MG/5ML PO SOLN: 5.0000 mg | Freq: Once | ORAL | Status: AC | PRN

## 2016-06-16 SURGICAL SUPPLY — 44 items
BAG INFUSER PRESSURE 100CC (MISCELLANEOUS) ×3 IMPLANT
BLADE SURG SZ11 CARB STEEL (BLADE) ×3 IMPLANT
CANISTER SUCT 1200ML W/VALVE (MISCELLANEOUS) ×3 IMPLANT
CANISTER SUCT 3000ML (MISCELLANEOUS) ×3 IMPLANT
CATH ROBINSON RED A/P 16FR (CATHETERS) ×3 IMPLANT
CHLORAPREP W/TINT 26ML (MISCELLANEOUS) ×3 IMPLANT
DRESSING TELFA 4X3 1S ST N-ADH (GAUZE/BANDAGES/DRESSINGS) ×9 IMPLANT
DRSG TEGADERM 2-3/8X2-3/4 SM (GAUZE/BANDAGES/DRESSINGS) ×9 IMPLANT
DRSG TEGADERM 4X4.75 (GAUZE/BANDAGES/DRESSINGS) ×9 IMPLANT
DRSG TELFA 3X8 NADH (GAUZE/BANDAGES/DRESSINGS) ×3 IMPLANT
GLOVE BIO SURGEON STRL SZ8 (GLOVE) ×5 IMPLANT
GLOVE INDICATOR 8.0 STRL GRN (GLOVE) ×5 IMPLANT
GOWN STRL REUS W/ TWL LRG LVL3 (GOWN DISPOSABLE) ×1 IMPLANT
GOWN STRL REUS W/ TWL XL LVL3 (GOWN DISPOSABLE) ×1 IMPLANT
GOWN STRL REUS W/TWL LRG LVL3 (GOWN DISPOSABLE) ×3
GOWN STRL REUS W/TWL XL LVL3 (GOWN DISPOSABLE) ×3
IRRIGATION STRYKERFLOW (MISCELLANEOUS) ×1 IMPLANT
IRRIGATOR STRYKERFLOW (MISCELLANEOUS) ×3
IV LACTATED RINGERS 1000ML (IV SOLUTION) ×3 IMPLANT
KIT PINK PAD W/HEAD ARE REST (MISCELLANEOUS) ×3
KIT PINK PAD W/HEAD ARM REST (MISCELLANEOUS) ×1 IMPLANT
KIT RM TURNOVER CYSTO AR (KITS) ×3 IMPLANT
LABEL OR SOLS (LABEL) IMPLANT
LIQUID BAND (GAUZE/BANDAGES/DRESSINGS) ×3 IMPLANT
NOVASURE ENDOMETRIAL ABLATION (MISCELLANEOUS) ×3 IMPLANT
NS IRRIG 500ML POUR BTL (IV SOLUTION) ×3 IMPLANT
PACK DNC HYST (MISCELLANEOUS) ×3 IMPLANT
PACK GYN LAPAROSCOPIC (MISCELLANEOUS) ×3 IMPLANT
PAD DRESSING TELFA 3X8 NADH (GAUZE/BANDAGES/DRESSINGS) ×1 IMPLANT
PAD OB MATERNITY 4.3X12.25 (PERSONAL CARE ITEMS) ×3 IMPLANT
PAD PREP 24X41 OB/GYN DISP (PERSONAL CARE ITEMS) ×3 IMPLANT
POUCH ENDO CATCH 10MM SPEC (MISCELLANEOUS) IMPLANT
SCISSORS METZENBAUM CVD 33 (INSTRUMENTS) IMPLANT
SLEEVE ENDOPATH XCEL 5M (ENDOMECHANICALS) ×3 IMPLANT
STRAP SAFETY BODY (MISCELLANEOUS) ×3 IMPLANT
SUT MNCRL 4-0 (SUTURE) ×3
SUT MNCRL 4-0 27XMFL (SUTURE) ×1
SUT VIC AB 0 UR5 27 (SUTURE) ×3 IMPLANT
SUTURE MNCRL 4-0 27XMF (SUTURE) ×1 IMPLANT
SYR 30ML LL (SYRINGE) ×3 IMPLANT
TROCAR XCEL NON-BLD 5MMX100MML (ENDOMECHANICALS) ×3 IMPLANT
TUBING CONNECTING 10 (TUBING) ×2 IMPLANT
TUBING CONNECTING 10' (TUBING) ×1
TUBING INSUFFLATOR HI FLOW (MISCELLANEOUS) ×3 IMPLANT

## 2016-06-16 NOTE — Op Note (Signed)
OPERATIVE NOTE:  Beverly DiversChristina M Haley PROCEDURE DATE: 06/16/2016   PREOPERATIVE DIAGNOSIS:  1. Dysmenorrhea 2. Menorrhagia with regular cycle 3. Enlarged uterus  POSTOPERATIVE DIAGNOSIS:  1. Dysmenorrhea 2. Menorrhagia with regular cycle 3. Enlarged uterus  PROCEDURE:  1. Laparoscopy with peritoneal biopsies 2. Hysteroscopy/D&C with NovaSure endometrial ablation   SURGEON:  Beverly HarmsMartin A Justen Fonda, MD ASSISTANTS: None ANESTHESIA: General INDICATIONS: 45 y.o. [id number] with history of menorrhagia with regular cycles and severe dysmenorrhea, clinical exam suspicious for endometriosis, presents for surgical evaluation and management.  FINDINGS:   1. White lesion with scarring in the cul-de-sac suspicious for endometriosis; prominent uterosacral ligaments without obvious lesions; no other peritoneal defects throughout the pelvis 2. Grossly normal uterus, fallopian tubes, and ovaries 3. Normal upper abdomen including liver and gallbladder and diaphragm 4. Shaggy endometrium on hysteroscopy without other abnormal findings   I/O's: Total I/O In: 700 [I.V.:700] Out: 115 [Urine:100; Blood:15] COUNTS:  YES SPECIMENS:  1. Peritoneal biopsies-cul-de-sac 2. Left uterosacral ligament biopsy 3. Right uterosacral ligament biopsy 4. Endometrial curettings  ANTIBIOTIC PROPHYLAXIS:N/A COMPLICATIONS: None immediate  PROCEDURE IN DETAIL: Patient was brought to the operating room and placed in the supine position. General endotracheal anesthesia was induced without difficulty. She is placed in the dorsal lithotomy position using bumblebee stirrups. A ChloraPrep and Hibiclens abdominal perineal intravaginal prep and drape were performed in standard fashion. Red Robinson catheter was used to drain 100 mL of urine from the bladder. Tenaculum was placed onto the cervix to facilitate uterine manipulation. Pelvis is gynecoid and patient is a candidate for LAVH if hysterectomy is indicated. Subumbilical  vertical incision 5 mm in length was made. The Optiview laparoscopic trocar system is placed directly into the abdominal pelvic cavity without evidence of bowel or vascular injury. A second 5 mm port was placed in the suprapubic region in the midline 2 fingerbreadths above the symphysis pubis. The above-noted findings were photo documented. Representative biopsies of the cul-de-sac, left and right uterosacral ligaments are taken. These are sent to pathology. Good hemostasis noted. Following complete survey of the pelvis, laparoscopy is finished. Pneumoperitoneum was released. Incisions were closed with 4-0 Vicryl simple interrupted sutures along with Dermabond glue. Hysteroscopy was performed in standard fashion. A Graves' speculum was placed in the vagina. Single-tooth tenaculum was placed onto the anterior lip of the cervix. Hanks dilators are used to dilate the endocervical canal to a #8 Jamaica[place] caliber. The uterus is sounded to 9 cm. The cervical length was noted to be 4 cm. The [facility] hysteroscope with lactated Ringer's as irrigant is used to assess the intrauterine cavity. Shaggy endometrium is noted without other pathologic lesions. The ostia of the fallopian tubes could not be seen. Smooth and serrated curettes were used to remove endometrial tissue. Specimen was sent to pathology. NovaSure endometrial ablation was then performed. The NovaSure instrument was placed into the uterus and deployed. The cavity width was noted to be 4 cm. The cavity length was calculated at 5 cm. Cavity test is completed and successful. The NovaSure instrument was deployed for a total of 60 seconds. Following ablation, the instrument was removed from the cavity. Repeat hysteroscopy demonstrated an excellent char effect on the endometrial cavity. Procedure was then terminated with all instrumentation being removed from the vagina and cervix. The patient was awakened, extubated and taken to the recovery room in satisfactory  condition.  Estle Sabella A. Beatris Sie Francesco, MD, [facility]

## 2016-06-16 NOTE — H&P (View-Only) (Signed)
Subjective: PREOPERATIVE HISTORY AND PHYSICAL      Date of surgery: 06/16/2016 Seizure: 1. Laparoscopy with peritoneal biopsies 2. Hysteroscopy/D&C with NovaSure endometrial ablation    Patient is a 45 y.o. [id number] scheduled for laparoscopy with peritoneal biopsies to rule out possible endometriosis. If no pathology is identified, patient will also have hysteroscopy/D&C with NovaSure endometrial ablation.   Past obstetric history: Para 3003, spontaneous vaginal delivery 3, largest infant 8 lbs. 9 oz.  Past gynecologic history:  Menarche: Age 45 Intervals: Monthly Duration: 7-8 days, heavy Lifelong history of severe dysmenorrhea requiring Midol, nonsteroidal anti-inflammatory medication and heating pad to help with pain management; has missed school in the past because of severe cramps Dysmenorrhea is notable for midline and bilateral adnexal cramping right greater than left, associated with low back pain with radiation into the buttocks and thighs; birth control pills never really helped her condition when she was younger; Depo-Provera help the cramps somewhat but did not cause amenorrhea; patient denies deep thrusting dyspareunia Several sisters also with history of severe menstrual cramps without a diagnosis of endometriosis    Bowel function is normal although she does experience some irritability around her menses Bladder function is normal although she does note some urinary frequency and urgency around her menses   Gynecologic History Patient's last menstrual period was 04/23/2016 (approximate). Contraception: vasectomy Last Pap: 2014 negative   Menstrual History: OB History    Gravida Para Term Preterm AB Living   3 3 3     3    SAB TAB Ectopic Multiple Live Births           3      Patient's last menstrual period was 05/26/2016 (exact date).    Past Medical History:  Diagnosis Date  . Hip pain   . Hyperlipidemia   . Migraine   . 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 APPENDECTOMY    . PLACEMENT OF BREAST IMPLANTS      OB History  Gravida Para Term Preterm AB Living  3 3 3     3   SAB TAB Ectopic Multiple Live Births          3    # Outcome Date GA Lbr Len/2nd Weight Sex Delivery Anes PTL Lv  3 Term 2006   7 lb 9.6 oz (3.447 kg) F Vag-Spont   LIV  2 Term 1999   8 lb 14.4 oz (4.037 kg) M Vag-Spont   LIV  1 Term 1995   7 lb 3.2 oz (3.266 kg) F Vag-Spont   LIV      Social History   Social History  . Marital status: Married    Spouse name: N/A  . Number of children: N/A  . Years of education: N/A   Social History Main Topics  . Smoking status: Never Smoker  . Smokeless tobacco: Never Used  . Alcohol use 21.0 oz/week    35 Cans of beer per week     Comment: 6-7 beers a night  . Drug use: No  . Sexual activity: Yes    Birth control/ protection: Surgical     Comment: Spouse had vasectomy   Other Topics Concern  . None   Social History Narrative  . None    Family History  Problem Relation Age of Onset  . Migraines Mother   . Hyperlipidemia Father   .  Hypertension Father   . Breast cancer Neg Hx   . Ovarian cancer Neg Hx   . Colon cancer Neg Hx   . Diabetes Neg Hx      (Not in a hospital admission)  No Known Allergies  Review of Systems Constitutional: No recent fever/chills/sweats Respiratory: No recent cough/bronchitis Cardiovascular: No chest pain Gastrointestinal: No recent nausea/vomiting/diarrhea Genitourinary: No UTI symptoms Hematologic/lymphatic:No history of coagulopathy or recent blood thinner use    Objective:    BP 94/63 (BP Location: Left Arm, Patient Position: Sitting, Cuff Size: Normal)   Pulse 69   Wt 137 lb (62.1 kg)   LMP 05/26/2016 (Exact Date)   BMI 22.11 kg/m   General:   Normal  Skin:   normal  HEENT:  Normal  Neck:  Supple without Adenopathy or Thyromegaly  Lungs:    Heart:              Breasts:   Abdomen:  Pelvis:  M/S   Extremeties:  Neuro:    clear to auscultation bilaterally   Normal without murmur   Not Examined   soft, non-tender; bowel sounds normal; no masses,  no organomegaly   Exam deferred to OR  No CVAT  Warm/Dry   Normal       05/15/2016 PELVIC:             External Genitalia: Normal             BUS: Normal             Vagina: Normal             Cervix: Normal; parous; 1/4 cervical motion tenderness             Uterus: Globular 10 week size, retroverted, 2/4 tender, shape,consistency, mobile             Adnexa: Nonpalpable; mild tenderness bilaterally 1/4             RV: Normal external exam             Bladder: Nontender  Assessment:     1. Menorrhagia with regular cycles 2. Severe dysmenorrhea 3. Normal pelvic ultrasound with normal uterine measurements 4. Endometriosis/adenomyosis may be the etiology to patient's symptoms; patient understands that if she has endometriosis, the best option for management would be hysterectomy rather than endometrial ablation 5. If laparoscopy is totally normal, we will then proceed at the same time to perform hysteroscopy/D&C with NovaSure endometrial ablation. If there are abnormal findings at laparoscopy, ablation will not be performed Plan:  1. Laparoscopy with peritoneal biopsies If laparoscopy is normal, we will then proceed with 2. Hysteroscopy/D&C with NovaSure endometrial ablation  Preop counseling: Patient is to undergo laparoscopy with peritoneal biopsies to rule out endometriosis. Should normal pelvic findings be identified, she will then undergo hysteroscopy/D&C with NovaSure endometrial ablation. The patient is understanding of the planned procedures and is aware of and is accepting of all risks which include but are not limited to bleeding, infection, pelvic organ injury with need for repair, blood clot disorders, anesthesia risks, etc. All questions have been answered.  Informed consent is given. Patient is ready and willing to proceed with surgery as scheduled.  Herold HarmsMartin A Harolyn Cocker, MD  Note: This dictation was prepared with Dragon dictation along with smaller phrase technology. Any transcriptional errors that result from this process are unintentional.

## 2016-06-16 NOTE — Transfer of Care (Signed)
Immediate Anesthesia Transfer of Care Note  Patient: Beverly DiversChristina M Balint  Procedure(s) Performed: Procedure(s): LAPAROSCOPY DIAGNOSTIC WITH BIOPSIES (N/A) HYSTEROSCOPY WITH NOVASURE (N/A)  Patient Location: PACU  Anesthesia Type:General  Level of Consciousness: awake  Airway & Oxygen Therapy: Patient Spontanous Breathing and Patient connected to face mask oxygen  Post-op Assessment: Report given to RN and Post -op Vital signs reviewed and stable  Post vital signs: Reviewed and stable  Last Vitals:  Vitals:   06/16/16 1308  BP: 124/62  Pulse: 92  Resp: 16  Temp: 36.9 C    Last Pain:  Vitals:   06/16/16 1308  TempSrc: Tympanic      Patients Stated Pain Goal: 2 (06/16/16 1308)  Complications: No apparent anesthesia complications

## 2016-06-16 NOTE — Anesthesia Postprocedure Evaluation (Signed)
Anesthesia Post Note  Patient: Sherrian DiversChristina M Pollinger  Procedure(s) Performed: Procedure(s) (LRB): LAPAROSCOPY DIAGNOSTIC WITH BIOPSIES (N/A) HYSTEROSCOPY WITH NOVASURE (N/A)  Patient location during evaluation: PACU Anesthesia Type: General Level of consciousness: awake and alert and oriented Pain management: pain level controlled Vital Signs Assessment: post-procedure vital signs reviewed and stable Respiratory status: spontaneous breathing Cardiovascular status: blood pressure returned to baseline Anesthetic complications: no    Last Vitals:  Vitals:   06/16/16 1711 06/16/16 1740  BP: (!) 105/47 114/73  Pulse: 67 68  Resp: 16 16  Temp:      Last Pain:  Vitals:   06/16/16 1740  TempSrc:   PainSc: 4                  German Manke

## 2016-06-16 NOTE — Anesthesia Procedure Notes (Signed)
Procedure Name: Intubation Performed by: Embree Brawley Pre-anesthesia Checklist: Patient identified, Patient being monitored, Timeout performed, Emergency Drugs available and Suction available Patient Re-evaluated:Patient Re-evaluated prior to inductionOxygen Delivery Method: Circle system utilized Preoxygenation: Pre-oxygenation with 100% oxygen Intubation Type: IV induction Ventilation: Mask ventilation without difficulty Laryngoscope Size: Mac and 3 Grade View: Grade I Tube type: Oral Tube size: 7.0 mm Number of attempts: 1 Airway Equipment and Method: Stylet Placement Confirmation: ETT inserted through vocal cords under direct vision,  positive ETCO2 and breath sounds checked- equal and bilateral Secured at: 21 cm Tube secured with: Tape Dental Injury: Teeth and Oropharynx as per pre-operative assessment        

## 2016-06-16 NOTE — Interval H&P Note (Signed)
History and Physical Interval Note:  06/16/2016 2:09 PM  Beverly Haley  has presented today for surgery, with the diagnosis of MENORRHAGIA, DYSMENORRHEA, ENLARGED UTERUS  The various methods of treatment have been discussed with the patient and family. After consideration of risks, benefits and other options for treatment, the patient has consented to  Procedure(s): LAPAROSCOPY DIAGNOSTIC WITH BIOPSIES (N/A) POSSIBLE HYSTEROSCOPY/D&C WITH NOVASURE ABLATION(N/A) as a surgical intervention .  The patient's history has been reviewed, patient examined, no change in status, stable for surgery.  I have reviewed the patient's chart and labs.  Questions were answered to the patient's satisfaction.     Daphine DeutscherMartin A Regine Christian

## 2016-06-16 NOTE — Progress Notes (Signed)
Heart rate 48 to 60  robinual given

## 2016-06-16 NOTE — Anesthesia Preprocedure Evaluation (Signed)
Anesthesia Evaluation  Patient identified by MRN, date of birth, ID band Patient awake    Reviewed: Allergy & Precautions, NPO status , Patient's Chart, lab work & pertinent test results  History of Anesthesia Complications Negative for: history of anesthetic complications  Airway Mallampati: II  TM Distance: >3 FB Neck ROM: Full    Dental no notable dental hx.    Pulmonary neg pulmonary ROS, neg sleep apnea, neg COPD,    breath sounds clear to auscultation- rhonchi (-) wheezing      Cardiovascular Exercise Tolerance: Good (-) hypertension(-) CAD and (-) Past MI  Rhythm:Regular Rate:Normal - Systolic murmurs and - Diastolic murmurs    Neuro/Psych  Headaches, negative psych ROS   GI/Hepatic negative GI ROS, Neg liver ROS,   Endo/Other  negative endocrine ROSneg diabetes  Renal/GU negative Renal ROS     Musculoskeletal negative musculoskeletal ROS (+)   Abdominal (+) - obese,   Peds  Hematology negative hematology ROS (+)   Anesthesia Other Findings Past Medical History: No date: Hip pain No date: Hyperlipidemia No date: Migraine No date: Other disorders of bone development and growth     Comment: 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   Dental advisory given  Plan Discussed with: CRNA and  Anesthesiologist  Anesthesia Plan Comments:         Anesthesia Quick Evaluation

## 2016-06-16 NOTE — Discharge Instructions (Signed)

## 2016-06-17 ENCOUNTER — Encounter: Payer: Self-pay | Admitting: Obstetrics and Gynecology

## 2016-06-17 ENCOUNTER — Telehealth: Payer: Self-pay

## 2016-06-17 NOTE — Telephone Encounter (Signed)
Pt is s/p lap w/ bx hysteroscopy and ablation x 1 day. She is hurting. Stomach is tight. Feels swollen.  Having pain all the way across belly. Also states she has a sunburn type rash above her belly button and all the way across her abd. Pink in color. Not itching. Incision - no drainage except in belly button- only slight. Has taken 1 percocet and 1 ibup today. NO fevers. Nausea and vomit x 1. Pt states she is staying hydrated but not wanting to eat. Advised pt to take benadryl po and or topical to rash on belly. May want to try gas x.  Increase pain meds. Ibup q 8h and percocet 1q4. Try brat diet and stay hydrated. Pt will f/u in 24 hours. If sx get worse or she develops a fever, or severe abd pain she will contact office asap.

## 2016-06-19 LAB — SURGICAL PATHOLOGY

## 2016-06-19 NOTE — Telephone Encounter (Signed)
Called pt cell "caller not available". Called home number and spoke to Amada JupiterDale (husband) he states she is doing better. Not has much pain and rash on abd is going away. Advised if any issues to contact office.

## 2016-07-01 ENCOUNTER — Encounter: Payer: Medicaid Other | Admitting: Obstetrics and Gynecology

## 2016-07-03 ENCOUNTER — Telehealth: Payer: Self-pay | Admitting: Obstetrics and Gynecology

## 2016-07-03 NOTE — Telephone Encounter (Signed)
Pt called and she thought her post op was today but it was Tuesday and she stated she had a paper that told her today, but she wanted to cx her appt out today even thought it was on Tuesday and she is requesting if the results from the BX could be called to her.

## 2016-07-04 NOTE — Telephone Encounter (Signed)
Pt aware of results. Would like to proceed with with hyst. Will check with insurance and call me back.

## 2016-07-07 NOTE — Telephone Encounter (Signed)
Pt is ready to proceed with hyst. Her medicaid runs out in 09/2016. Would like her hyst asap. LAVH for aub pp. [facility]

## 2016-07-15 NOTE — Telephone Encounter (Signed)
Letter Sent.

## 2016-08-25 ENCOUNTER — Inpatient Hospital Stay: Admission: RE | Admit: 2016-08-25 | Payer: Medicaid Other | Source: Ambulatory Visit

## 2016-08-26 ENCOUNTER — Inpatient Hospital Stay: Admission: RE | Admit: 2016-08-26 | Payer: Medicaid Other | Source: Ambulatory Visit

## 2016-08-27 ENCOUNTER — Inpatient Hospital Stay: Admission: RE | Admit: 2016-08-27 | Payer: Medicaid Other | Source: Ambulatory Visit

## 2016-08-28 ENCOUNTER — Encounter: Payer: Medicaid Other | Admitting: Obstetrics and Gynecology

## 2016-08-29 ENCOUNTER — Inpatient Hospital Stay: Admission: RE | Admit: 2016-08-29 | Payer: Medicaid Other | Source: Ambulatory Visit

## 2016-08-29 ENCOUNTER — Encounter: Payer: Medicaid Other | Admitting: Obstetrics and Gynecology

## 2016-09-01 ENCOUNTER — Encounter: Admission: RE | Payer: Self-pay | Source: Ambulatory Visit

## 2016-09-01 ENCOUNTER — Ambulatory Visit
Admission: RE | Admit: 2016-09-01 | Payer: Medicaid Other | Source: Ambulatory Visit | Admitting: Obstetrics and Gynecology

## 2016-09-01 SURGERY — HYSTERECTOMY, VAGINAL, LAPAROSCOPY-ASSISTED
Anesthesia: General

## 2016-10-21 ENCOUNTER — Encounter: Payer: Self-pay | Admitting: Family Medicine

## 2016-10-21 ENCOUNTER — Ambulatory Visit (INDEPENDENT_AMBULATORY_CARE_PROVIDER_SITE_OTHER): Payer: Medicaid Other | Admitting: Family Medicine

## 2016-10-21 VITALS — BP 119/69 | HR 67 | Temp 98.5°F | Wt 136.0 lb

## 2016-10-21 DIAGNOSIS — I7300 Raynaud's syndrome without gangrene: Secondary | ICD-10-CM | POA: Diagnosis not present

## 2016-10-21 DIAGNOSIS — G8929 Other chronic pain: Secondary | ICD-10-CM

## 2016-10-21 DIAGNOSIS — M5442 Lumbago with sciatica, left side: Secondary | ICD-10-CM | POA: Diagnosis not present

## 2016-10-21 MED ORDER — CYCLOBENZAPRINE HCL 10 MG PO TABS: 10.0000 mg | ORAL_TABLET | Freq: Three times a day (TID) | ORAL | 0 refills | Status: DC | PRN

## 2016-10-21 NOTE — Patient Instructions (Signed)
Raynaud's disease
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
## 2016-11-18 ENCOUNTER — Ambulatory Visit (INDEPENDENT_AMBULATORY_CARE_PROVIDER_SITE_OTHER): Payer: Medicaid Other | Admitting: Family Medicine

## 2016-11-18 ENCOUNTER — Encounter: Payer: Self-pay | Admitting: Family Medicine

## 2016-11-18 VITALS — BP 128/85 | HR 73 | Temp 97.3°F | Wt 136.2 lb

## 2016-11-18 DIAGNOSIS — R450 Nervousness: Secondary | ICD-10-CM | POA: Diagnosis not present

## 2016-11-18 DIAGNOSIS — R61 Generalized hyperhidrosis: Secondary | ICD-10-CM

## 2016-11-18 DIAGNOSIS — R1010 Upper abdominal pain, unspecified: Secondary | ICD-10-CM

## 2016-11-18 DIAGNOSIS — G43709 Chronic migraine without aura, not intractable, without status migrainosus: Secondary | ICD-10-CM | POA: Diagnosis not present

## 2016-11-18 DIAGNOSIS — W57XXXA Bitten or stung by nonvenomous insect and other nonvenomous arthropods, initial encounter: Secondary | ICD-10-CM

## 2016-11-18 MED ORDER — ONDANSETRON 4 MG PO TBDP: 4.0000 mg | ORAL_TABLET | Freq: Three times a day (TID) | ORAL | 1 refills | Status: DC | PRN

## 2016-11-18 MED ORDER — KETOROLAC TROMETHAMINE 60 MG/2ML IM SOLN
60.0000 mg | Freq: Once | INTRAMUSCULAR | Status: AC
  Administered 2016-11-18: 60 mg via INTRAMUSCULAR

## 2016-11-18 NOTE — Patient Instructions (Addendum)
Abdominal Pain, Adult Abdominal pain can be caused by many things. Often, abdominal pain is not serious and it gets better with no treatment or by being treated at home. However, sometimes abdominal pain is serious. Your health care provider will do a medical history and a physical exam to try to determine the cause of your abdominal pain. Follow these instructions at home:  Take over-the-counter and prescription medicines only as told by your health care provider. Do not take a laxative unless told by your health care provider.  Drink enough fluid to keep your urine clear or pale yellow.  Watch your condition for any changes.  Keep all follow-up visits as told by your health care provider. This is important. Contact a health care provider if:  Your abdominal pain changes or gets worse.  You are not hungry or you lose weight without trying.  You are constipated or have diarrhea for more than 2-3 days.  You have pain when you urinate or have a bowel movement.  Your abdominal pain wakes you up at night.  Your pain gets worse with meals, after eating, or with certain foods.  You are throwing up and cannot keep anything down.  You have a fever. Get help right away if:  Your pain does not go away as soon as your health care provider told you to expect.  You cannot stop throwing up.  Your pain is only in areas of the abdomen, such as the right side or the left lower portion of the abdomen.  You have bloody or black stools, or stools that look like tar.  You have severe pain, cramping, or bloating in your abdomen.  You have signs of dehydration, such as:  Dark urine, very little urine, or no urine.  Cracked lips.  Dry mouth.  Sunken eyes.  Sleepiness.  Weakness. This information is not intended to replace advice given to you by your health care provider. Make sure you discuss any questions you have with your health care provider. Document Released: 04/30/2005 Document  Revised: 02/08/2016 Document Reviewed: 01/02/2016 Elsevier Interactive Patient Education  2017 [employer]  Tick Bite Information, Adult Ticks are insects that draw blood for food. Most ticks live in shrubs and grassy areas. They climb onto people and animals that brush against the leaves and grasses that they rest on. Then they bite, attaching themselves to the skin. Most ticks are harmless, but some ticks carry germs that can spread to a person through a bite and cause a disease. To reduce your risk of getting a disease from a tick bite, it is important to take steps to prevent tick bites. It is also important to check for ticks after being outdoors. If you find that a tick has attached to you, watch for symptoms of disease. How can I prevent tick bites? Take these steps to help prevent tick bites when you are outdoors in an area where ticks are found:  Use insect repellent that has DEET (20% or higher), picaridin, or IR3535 in it. Use it on:  Skin that is showing.  The top of your boots.  Your pant legs.  Your sleeve cuffs.  For repellent products that contain permethrin, follow product instructions. Use these products on:  Clothing.  Gear.  Boots.  Tents.  Wear protective clothing. Long sleeves and long pants offer the best protection from ticks.  Wear light-colored clothing so you can see ticks more easily.  Tuck your pant legs into your socks.  If you  go walking on a trail, stay in the middle of the trail so your skin, hair, and clothing do not touch the bushes.  Avoid walking through areas with long grass.  Check for ticks on your clothing, hair, and skin often while you are outside, and check again before you go inside. Make sure to check the places that ticks attach themselves most often. These places include the scalp, neck, armpits, waist, groin, and joint areas. Ticks that carry a disease called Lyme disease have to be attached to the skin for 24-48 hours.  Checking for ticks every day will lessen your risk of this and other diseases.  When you come indoors, wash your clothes and take a shower or a bath right away. Dry your clothes in a dryer on high heat for at least 60 minutes. This will kill any ticks in your clothes. What is the proper way to remove a tick? If you find a tick on your body, remove it as soon as possible. Removing a tick sooner rather than later can prevent germs from passing from the tick to your body. To remove a tick that is crawling on your skin but has not bitten:  Go outdoors and brush the tick off.  Remove the tick with tape or a lint roller. To remove a tick that is attached to your skin:  Wash your hands.  If you have latex gloves, put them on.  Use tweezers, curved forceps, or a tick-removal tool to gently grasp the tick as close to your skin and the tick's head as possible.  Gently pull with steady, upward pressure until the tick lets go. When removing the tick:  Take care to keep the tick's head attached to its body.  Do not twist or jerk the tick. This can make the tick's head or mouth break off.  Do not squeeze or crush the tick's body. This could force disease-carrying fluids from the tick into your body. Do not try to remove a tick with heat, alcohol, petroleum jelly, or fingernail polish. Using these methods can cause the tick to salivate and regurgitate into your bloodstream, increasing your risk of getting a disease. What should I do after removing a tick?  Clean the bite area with soap and water, rubbing alcohol, or an iodine scrub.  If an antiseptic cream or ointment is available, apply a small amount to the bite site.  Wash and disinfect any instruments that you used to remove the tick. How should I dispose of a tick? To dispose of a live tick, use one of these methods:  Place it in rubbing alcohol.  Place it in a sealed bag or container.  Wrap it tightly in tape.  Flush it down the  toilet. Contact a health care provider if:  You have symptoms of a disease after a tick bite. Symptoms of a tick-borne disease can occur from moments after the tick bites to up to 30 days after a tick is removed. Symptoms include:  Muscle, joint, or bone pain.  Difficulty walking or moving your legs.  Numbness in the legs.  Paralysis.  Red rash around the tick bite area that is shaped like a target or a "bull's-eye."  Redness and swelling in the area of the tick bite.  Fever.  Repeated vomiting.  Diarrhea.  Weight loss.  Tender, swollen lymph glands.  Shortness of breath.  Cough.  Pain in the abdomen.  Headache.  Abnormal tiredness.  A change in your level of consciousness.  Confusion. Get help right away if:  You are not able to remove a tick.  A part of a tick breaks off and gets stuck in your skin.  Your symptoms get worse. Summary  Ticks may carry germs that can spread to a person through a bite and cause disease.  Wear protective clothing and use insect repellent to prevent tick bites. Follow product instructions.  If you find a tick on your body, remove it as soon as possible. If the tick is attached, do not try to remove with heat, alcohol, petroleum jelly, or fingernail polish.  Remove the attached tick using tweezers, curved forceps, or a tick-removal tool. Gently pull with steady, upward pressure until the tick lets go. Do not twist or jerk the tick. Do not squeeze or crush the tick's body.  If you have symptoms after being bitten by a tick, contact a health care provider. This information is not intended to replace advice given to you by your health care provider. Make sure you discuss any questions you have with your health care provider. Document Released: 07/18/2000 Document Revised: 05/02/2016 Document Reviewed: 05/02/2016 Elsevier Interactive Patient Education  2017 [employer]  [place] Diet A bland diet consists of foods that do not  have a lot of fat or fiber. Foods without fat or fiber are easier for the body to digest. They are also less likely to irritate your mouth, throat, stomach, and other parts of your gastrointestinal tract. A bland diet is sometimes called a BRAT diet. What is my plan? Your health care provider or dietitian may recommend specific changes to your diet to prevent and treat your symptoms, such as:  Eating small meals often.  Cooking food until it is soft enough to chew easily.  Chewing your food well.  Drinking fluids slowly.  Not eating foods that are very spicy, sour, or fatty.  Not eating citrus fruits, such as oranges and grapefruit. What do I need to know about this diet?  Eat a variety of foods from the bland diet food list.  Do not follow a bland diet longer than you have to.  Ask your health care provider whether you should take vitamins. What foods can I eat? Grains   Hot cereals, such as cream of wheat. Bread, crackers, or tortillas made from refined white flour. Rice. Vegetables  Canned or cooked vegetables. Mashed or boiled potatoes. Fruits  Bananas. Applesauce. Other types of cooked or canned fruit with the skin and seeds removed, such as canned peaches or pears. Meats and Other Protein Sources  Scrambled eggs. Creamy peanut butter or other nut butters. Lean, well-cooked meats, such as chicken or fish. Tofu. Soups or broths. Dairy  Low-fat dairy products, such as milk, cottage cheese, or yogurt. Beverages  Water. Herbal tea. Apple juice. Sweets and Desserts  Pudding. Custard. Fruit gelatin. Ice cream. Fats and Oils  Mild salad dressings. Canola or olive oil. The items listed above may not be a complete list of allowed foods or beverages. Contact your dietitian for more options.  What foods are not recommended? Foods and ingredients that are often not recommended include:  Spicy foods, such as hot sauce or salsa.  Fried foods.  Sour foods, such as pickled or  fermented foods.  Raw vegetables or fruits, especially citrus or berries.  Caffeinated drinks.  Alcohol.  Strongly flavored seasonings or condiments. The items listed above may not be a complete list of foods and beverages that are not allowed. Contact your dietitian for  more information.  This information is not intended to replace advice given to you by your health care provider. Make sure you discuss any questions you have with your health care provider. Document Released: 11/12/2015 Document Revised: 12/27/2015 Document Reviewed: 08/02/2014 Elsevier Interactive Patient Education  2017 [employer].

## 2016-11-18 NOTE — Progress Notes (Signed)
BP 128/85 (BP Location: Left Arm, Patient Position: Sitting, Cuff Size: Normal)   Pulse 73   Temp 97.3 F (36.3 C)   Wt 136 lb 3.2 oz (61.8 kg)   SpO2 100%   BMI 21.98 kg/m    Subjective:    [name], female    DOB: [birth year], 46 y.o.   MRN: [id number]  HPI: Beverly Haley is a 46 y.o. female  Chief Complaint  Patient presents with  . Other    Patient states that she is feeling jittery   [name] presents today feeling terrible. She notes that she was fine last night and when she woke up this morning and ws fine until about 10AM when she started with a with severe headache, shaking and feeling like she was going to pass out. Intermittent R ear pain. Still feeling that way, coming and going. Has been nauseous and has vomited 4x today. Pain in upper belly. No UTI symptoms, No cold symptoms. No sick contacts. No weight loss. No meds, not ETOH or drug use. Did have a tick bite a couple of days ago without rash, was attached less than 24 hours.   Relevant past medical, surgical, family and social history reviewed and updated as indicated. Interim medical history since our last visit reviewed. Allergies and medications reviewed and updated.  Review of Systems  Constitutional: Positive for chills, diaphoresis, fatigue and fever. Negative for activity change, appetite change and unexpected weight change.  HENT: Positive for ear pain. Negative for congestion, dental problem, drooling, ear discharge, facial swelling, hearing loss, mouth sores, nosebleeds, postnasal drip, rhinorrhea, sinus pain, sinus pressure, sneezing, sore throat, tinnitus, trouble swallowing and voice change.   Respiratory: Negative.   Cardiovascular: Negative.   Gastrointestinal: Positive for abdominal pain, nausea and vomiting. Negative for abdominal distention, anal bleeding, blood in stool, constipation, diarrhea and rectal pain.  Genitourinary: Negative.   Neurological: Positive for  tremors, light-headedness and headaches. Negative for dizziness, seizures, syncope, facial asymmetry, speech difficulty, weakness and numbness.  Psychiatric/Behavioral: Negative.     Per HPI unless specifically indicated above     Objective:    BP 128/85 (BP Location: Left Arm, Patient Position: Sitting, Cuff Size: Normal)   Pulse 73   Temp 97.3 F (36.3 C)   Wt 136 lb 3.2 oz (61.8 kg)   SpO2 100%   BMI 21.98 kg/m   Wt Readings from Last 3 Encounters:  11/18/16 136 lb 3.2 oz (61.8 kg)  10/21/16 136 lb (61.7 kg)  06/16/16 137 lb (62.1 kg)    Physical Exam  Constitutional: She is oriented to person, place, and time. She appears well-developed and well-nourished. No distress.  HENT:  Head: Normocephalic and atraumatic.  Right Ear: Hearing and external ear normal.  Left Ear: Hearing normal.  Nose: Nose normal.  Mouth/Throat: Oropharynx is clear and moist. No oropharyngeal exudate.  Eyes: Conjunctivae, EOM and lids are normal. Pupils are equal, round, and reactive to light. Right eye exhibits no discharge. Left eye exhibits no discharge. No scleral icterus.  Neck: Normal range of motion. Neck supple. No JVD present. No tracheal deviation present. No thyromegaly present.  Cardiovascular: Normal rate, regular rhythm, normal heart sounds and intact distal pulses.  Exam reveals no gallop and no friction rub.   No murmur heard. Pulmonary/Chest: Effort normal and breath sounds normal. No stridor. No respiratory distress. She has no wheezes. She has no rales. She exhibits no tenderness.  Abdominal: Soft. Bowel sounds are normal. She  exhibits no distension and no mass. There is tenderness (Lower quadrents bilaterally, no guarding or rebound). There is no rebound and no guarding.  Musculoskeletal: Normal range of motion.  Lymphadenopathy:    She has no cervical adenopathy.  Neurological: She is alert and oriented to person, place, and time. She has normal reflexes. She displays normal  reflexes. No cranial nerve deficit. She exhibits normal muscle tone. Coordination normal.  Skin: Skin is warm and intact. No rash noted. She is diaphoretic. No erythema. No pallor.  Psychiatric: She has a normal mood and affect. Her speech is normal and behavior is normal. Judgment and thought content normal. Cognition and memory are normal.  Nursing note and vitals reviewed.  EKG normal sinus rhythm. No concerns.     Assessment & Plan:   Problem List Items Addressed This Visit    None    Visit Diagnoses    Jittery feeling    -  Primary   Of unclear etiology. ?Viral. CBC normal. No sign localizing symptoms. Warning signs discussed. Zofran given. Follow up Friday if not sooner.    Relevant Orders   TSH   UA/M w/rflx Culture, Routine   Hgb A1c w/o eAG   Diaphoresis       EKG normal. Checking labs, possibly viral. Await results.    Relevant Orders   EKG 12-Lead (Completed)   UA/M w/rflx Culture, Routine   Tick bite, initial encounter       Will check labs. Await results.    Relevant Orders   Lyme Ab/Western Blot Reflex   Rocky mtn spotted fvr abs pnl(IgG+IgM)   Ehrlichia Antibody Panel   Babesia microti Antibody Panel   Pain of upper abdomen       Likely due to vomiting. Checking labs. Await results.    Relevant Orders   CBC With Differential/Platelet   Comprehensive metabolic panel   UA/M w/rflx Culture, Routine   Chronic migraine without aura without status migrainosus, not intractable       Has history of migraines as a kid. Will give toradol shot today. Recheck Friday. Await results. Warning signs to go to to ER given today   Relevant Medications   ketorolac (TORADOL) injection 60 mg       Follow up plan: Return Friday, for Follow up.

## 2016-11-19 ENCOUNTER — Telehealth: Payer: Self-pay | Admitting: Family Medicine

## 2016-11-19 DIAGNOSIS — R1030 Lower abdominal pain, unspecified: Secondary | ICD-10-CM

## 2016-11-19 LAB — EHRLICHIA ANTIBODY PANEL
E. Chaffeensis (HME) IgM Titer: NEGATIVE
E.Chaffeensis (HME) IgG: NEGATIVE
HGE IgG Titer: NEGATIVE
HGE IgM Titer: NEGATIVE

## 2016-11-19 NOTE — Telephone Encounter (Signed)
[name] states that her head is still feeling bad and she is having cramping in her belly. Dizziness has resolved. She notes that she has had really bad endometriosis and wonders if that is what's causing her symptoms. Will obtain pelvic ultrasound to r/o ovarian cyst. Order in. She will call her GYN

## 2016-11-20 ENCOUNTER — Telehealth: Payer: Self-pay | Admitting: Family Medicine

## 2016-11-20 ENCOUNTER — Encounter: Payer: Self-pay | Admitting: Family Medicine

## 2016-11-20 ENCOUNTER — Ambulatory Visit (INDEPENDENT_AMBULATORY_CARE_PROVIDER_SITE_OTHER): Payer: Medicaid Other | Admitting: Family Medicine

## 2016-11-20 VITALS — BP 118/78 | HR 74 | Temp 98.2°F | Wt 136.4 lb

## 2016-11-20 DIAGNOSIS — R450 Nervousness: Secondary | ICD-10-CM | POA: Diagnosis not present

## 2016-11-20 DIAGNOSIS — R519 Headache, unspecified: Secondary | ICD-10-CM

## 2016-11-20 DIAGNOSIS — R1030 Lower abdominal pain, unspecified: Secondary | ICD-10-CM

## 2016-11-20 DIAGNOSIS — R51 Headache: Principal | ICD-10-CM

## 2016-11-20 DIAGNOSIS — G43709 Chronic migraine without aura, not intractable, without status migrainosus: Secondary | ICD-10-CM

## 2016-11-20 DIAGNOSIS — R509 Fever, unspecified: Secondary | ICD-10-CM | POA: Diagnosis not present

## 2016-11-20 LAB — LYME AB/WESTERN BLOT REFLEX: Lyme IgG/IgM Ab: <0.91 {ISR} (ref 0.00–0.90)

## 2016-11-20 LAB — COMPREHENSIVE METABOLIC PANEL
ALT: 15 IU/L (ref 0–32)
AST: 27 IU/L (ref 0–40)
Albumin/Globulin Ratio: 1.8 (ref 1.2–2.2)
Albumin: 4.6 g/dL (ref 3.5–5.5)
Alkaline Phosphatase: 78 IU/L (ref 39–117)
BUN/Creatinine Ratio: 14 (ref 9–23)
BUN: 12 mg/dL (ref 6–24)
Bilirubin Total: 0.4 mg/dL (ref 0.0–1.2)
CALCIUM: 9.1 mg/dL (ref 8.7–10.2)
CO2: 24 mmol/L (ref 18–29)
CREATININE: 0.86 mg/dL (ref 0.57–1.00)
Chloride: 100 mmol/L (ref 96–106)
GFR calc Af Amer: 94 mL/min/{1.73_m2} (ref >59)
GFR, EST NON AFRICAN AMERICAN: 82 mL/min/{1.73_m2} (ref >59)
Globulin, Total: 2.6 g/dL (ref 1.5–4.5)
Glucose: 96 mg/dL (ref 65–99)
POTASSIUM: 4.4 mmol/L (ref 3.5–5.2)
Sodium: 141 mmol/L (ref 134–144)
Total Protein: 7.2 g/dL (ref 6.0–8.5)

## 2016-11-20 LAB — CBC WITH DIFFERENTIAL/PLATELET
HEMOGLOBIN: 12.9 g/dL (ref 11.1–15.9)
Hematocrit: 40.7 % (ref 34.0–46.6)
Hematocrit: 38.9 % (ref 34.0–46.6)
Hemoglobin: 13.7 g/dL (ref 11.1–15.9)
LYMPHS ABS: 1.5 10*3/uL (ref 0.7–3.1)
Lymphocytes Absolute: 1.2 10*3/uL (ref 0.7–3.1)
Lymphs: 13 %
Lymphs: 22 %
MCH: 30.2 pg (ref 26.6–33.0)
MCH: 30.4 pg (ref 26.6–33.0)
MCHC: 33.7 g/dL (ref 31.5–35.7)
MCHC: 33.2 g/dL (ref 31.5–35.7)
MCV: 90 fL (ref 79–97)
MCV: 92 fL (ref 79–97)
MID (ABSOLUTE): 0.2 10*3/uL (ref 0.1–1.6)
MID (Absolute): 0.4 10*3/uL (ref 0.1–1.6)
MID: 2 %
MID: 8 %
NEUTROS PCT: 70 %
Neutrophils Absolute: 9.4 10*3/uL — ABNORMAL HIGH (ref 1.4–7.0)
Neutrophils Absolute: 3.9 10*3/uL (ref 1.4–7.0)
Neutrophils: 85 %
PLATELETS: 257 10*3/uL (ref 150–379)
PLATELETS: 216 10*3/uL (ref 150–379)
RBC: 4.53 x10E6/uL (ref 3.77–5.28)
RBC: 4.24 x10E6/uL (ref 3.77–5.28)
RDW: 15.9 % — AB (ref 12.3–15.4)
RDW: 15.5 % — ABNORMAL HIGH (ref 12.3–15.4)
WBC: 11.1 10*3/uL — ABNORMAL HIGH (ref 3.4–10.8)
WBC: 5.5 10*3/uL (ref 3.4–10.8)

## 2016-11-20 LAB — MICROSCOPIC EXAMINATION: RBC, UA: NONE SEEN /hpf (ref 0–?)

## 2016-11-20 LAB — URINE CULTURE, REFLEX: Organism ID, Bacteria: NO GROWTH

## 2016-11-20 LAB — UA/M W/RFLX CULTURE, ROUTINE
BILIRUBIN UA: NEGATIVE
Glucose, UA: NEGATIVE
KETONES UA: NEGATIVE
LEUKOCYTES UA: NEGATIVE
NITRITE UA: NEGATIVE
PH UA: 7 (ref 5.0–7.5)
RBC UA: NEGATIVE
Specific Gravity, UA: 1.02 (ref 1.005–1.030)
UUROB: 0.2 mg/dL (ref 0.2–1.0)

## 2016-11-20 LAB — HGB A1C W/O EAG: HEMOGLOBIN A1C: 4.8 % (ref 4.8–5.6)

## 2016-11-20 LAB — ROCKY MTN SPOTTED FVR ABS PNL(IGG+IGM)
RMSF IGG: NEGATIVE
RMSF IGM: 0.3 {index} (ref 0.00–0.89)

## 2016-11-20 LAB — TSH: TSH: 0.715 u[IU]/mL (ref 0.450–4.500)

## 2016-11-20 LAB — BABESIA MICROTI ANTIBODY PANEL: Babesia microti IgM: <1:10 {titer}

## 2016-11-20 MED ORDER — MECLIZINE HCL 25 MG PO TABS: 25.0000 mg | ORAL_TABLET | Freq: Three times a day (TID) | ORAL | 1 refills | Status: DC | PRN

## 2016-11-20 NOTE — Progress Notes (Signed)
BP 118/78 (BP Location: Left Arm, Patient Position: Sitting, Cuff Size: Large)   Pulse 74   Temp 98.2 F (36.8 C)   Wt 136 lb 6.4 oz (61.9 kg)   SpO2 99%   BMI 22.02 kg/m    Subjective:    [name], female    DOB: 08-10-1970, 46 y.o.   MRN: [id number]  HPI: Beverly Haley is a 46 y.o. female  Chief Complaint  Patient presents with  . Nausea    Patient states that she was off balance last night, with a headache and a temperature of 100.8   Yesterday, woke up and felt better. Started feeling worse again about 5:45PM, started feeling really dizzy and off balance. She got nauseous again- didn't throw up because of the zofran. Got really tired after this and ended up with terrible headache again. Still lingering- hasn't taken anything for it. She has been eating better and has been keeping down fluids. She does note that she had a fever to 100.8 last night, but hasn't had any other issues today. She still notes that her lower abdomen is hurting a whole lot, especially on the R side. She notes that she has had an appendectomy. She has a history of endometriosis, relatively severe. She was supposed to have hysterectomy, but didn't, and ended up having an ablation instead. She had a spot on her chin this week that she usually only gets with hormonal fluctuations with her period. She is having her [place] on Wednesday, seeing GYN on Friday next week. She is scared about this and unsure what's going on. No other concerns or complaints at this time. Labs from Tuesday reviewed with patient today.  Relevant past medical, surgical, family and social history reviewed and updated as indicated. Interim medical history since our last visit reviewed. Allergies and medications reviewed and updated.  Review of Systems  Constitutional: Positive for chills, diaphoresis, fatigue and fever. Negative for activity change, appetite change and unexpected weight change.  HENT: Negative.     Respiratory: Negative.   Cardiovascular: Positive for palpitations. Negative for chest pain and leg swelling.  Gastrointestinal: Positive for abdominal pain and nausea. Negative for abdominal distention, anal bleeding, blood in stool, constipation, diarrhea, rectal pain and vomiting.  Endocrine: Negative.   Genitourinary: Negative.   Neurological: Positive for dizziness and light-headedness. Negative for tremors, seizures, syncope, facial asymmetry, speech difficulty, weakness, numbness and headaches.  Psychiatric/Behavioral: Positive for agitation and confusion. Negative for behavioral problems, decreased concentration, dysphoric mood, hallucinations, self-injury, sleep disturbance and suicidal ideas. The patient is nervous/anxious. The patient is not hyperactive.     Per HPI unless specifically indicated above     Objective:    BP 118/78 (BP Location: Left Arm, Patient Position: Sitting, Cuff Size: Large)   Pulse 74   Temp 98.2 F (36.8 C)   Wt 136 lb 6.4 oz (61.9 kg)   SpO2 99%   BMI 22.02 kg/m   Wt Readings from Last 3 Encounters:  11/20/16 136 lb 6.4 oz (61.9 kg)  11/18/16 136 lb 3.2 oz (61.8 kg)  10/21/16 136 lb (61.7 kg)    Physical Exam  Constitutional: She is oriented to person, place, and time. She appears well-developed and well-nourished. No distress.  HENT:  Head: Normocephalic and atraumatic.  Right Ear: Hearing and external ear normal.  Left Ear: Hearing and external ear normal.  Nose: Nose normal.  Mouth/Throat: Oropharynx is clear and moist. No oropharyngeal exudate.  Eyes: Conjunctivae and  lids are normal. Pupils are equal, round, and reactive to light. Right eye exhibits no discharge. Left eye exhibits no discharge. No scleral icterus. Right eye exhibits no nystagmus. Left eye exhibits nystagmus.  Cardiovascular: Normal rate, regular rhythm, normal heart sounds and intact distal pulses.  Exam reveals no gallop and no friction rub.   No murmur  heard. Pulmonary/Chest: Effort normal and breath sounds normal. No respiratory distress. She has no wheezes. She has no rales. She exhibits no tenderness.  Abdominal: Soft. Bowel sounds are normal. She exhibits no distension and no mass. There is tenderness (RLQ). There is no rebound and no guarding.  Musculoskeletal: Normal range of motion.  Neurological: She is alert and oriented to person, place, and time.  Skin: Skin is warm, dry and intact. No rash noted. She is not diaphoretic. No erythema. No pallor.  Psychiatric: Her speech is normal and behavior is normal. Judgment and thought content normal. Her mood appears anxious. Cognition and memory are normal.  Nursing note and vitals reviewed.   Results for orders placed or performed in visit on 11/18/16  Microscopic Examination  Result Value Ref Range   WBC, UA 0-5 0 - 5 /hpf   RBC, UA None seen 0 - 2 /hpf   Epithelial Cells (non renal) 0-10 0 - 10 /hpf   Bacteria, UA Moderate (A) None seen/Few  CBC With Differential/Platelet  Result Value Ref Range   WBC 11.1 (H) 3.4 - 10.8 x10E3/uL   RBC 4.53 3.77 - 5.28 x10E6/uL   Hemoglobin 13.7 11.1 - 15.9 g/dL   Hematocrit 40.9 81.1 - 46.6 %   MCV 90 79 - 97 fL   MCH 30.2 26.6 - 33.0 pg   MCHC 33.7 31.5 - 35.7 g/dL   RDW 91.4 (H) 78.2 - 95.6 %   Platelets 257 150 - 379 x10E3/uL   Neutrophils 85 Not Estab. %   Lymphs 13 Not Estab. %   MID 2 Not Estab. %   Neutrophils Absolute 9.4 (H) 1.4 - 7.0 x10E3/uL   Lymphocytes Absolute 1.5 0.7 - 3.1 x10E3/uL   MID (Absolute) 0.2 0.1 - 1.6 X10E3/uL  Comprehensive metabolic panel  Result Value Ref Range   Glucose 96 65 - 99 mg/dL   BUN 12 6 - 24 mg/dL   Creatinine, Ser 2.13 0.57 - 1.00 mg/dL   GFR calc non Af Amer 82 >59 mL/min/1.73   GFR calc Af Amer 94 >59 mL/min/1.73   BUN/Creatinine Ratio 14 9 - 23   Sodium 141 134 - 144 mmol/L   Potassium 4.4 3.5 - 5.2 mmol/L   Chloride 100 96 - 106 mmol/L   CO2 24 18 - 29 mmol/L   Calcium 9.1 8.7 - 10.2  mg/dL   Total Protein 7.2 6.0 - 8.5 g/dL   Albumin 4.6 3.5 - 5.5 g/dL   Globulin, Total 2.6 1.5 - 4.5 g/dL   Albumin/Globulin Ratio 1.8 1.2 - 2.2   Bilirubin Total 0.4 0.0 - 1.2 mg/dL   Alkaline Phosphatase 78 39 - 117 IU/L   AST 27 0 - 40 IU/L   ALT 15 0 - 32 IU/L  Lyme Ab/Western Blot Reflex  Result Value Ref Range   Lyme IgG/IgM Ab <0.91 0.00 - 0.90 ISR   LYME DISEASE AB, QUANT, IGM <0.80 0.00 - 0.79 index  Rocky mtn spotted fvr abs pnl(IgG+IgM)  Result Value Ref Range   RMSF IgG Negative Negative   RMSF IgM 0.30 0.00 - 0.89 index  Ehrlichia Antibody Panel  Result  Value Ref Range   E.Chaffeensis (HME) IgG Negative Neg:<1:64   E. Chaffeensis (HME) IgM Titer Negative Neg:<1:20   HGE IgG Titer Negative Neg:<1:64   HGE IgM Titer Negative Neg:<1:20  Babesia microti Antibody Panel  Result Value Ref Range   Babesia microti IgM <1:10 Neg:<1:10   Babesia microti IgG Comment Neg:<1:10  TSH  Result Value Ref Range   TSH 0.715 0.450 - 4.500 uIU/mL  UA/M w/rflx Culture, Routine  Result Value Ref Range   Specific Gravity, UA 1.020 1.005 - 1.030   pH, UA 7.0 5.0 - 7.5   Color, UA Yellow Yellow   Appearance Ur Cloudy (A) Clear   Leukocytes, UA Negative Negative   Protein, UA Trace (A) Negative/Trace   Glucose, UA Negative Negative   Ketones, UA Negative Negative   RBC, UA Negative Negative   Bilirubin, UA Negative Negative   Urobilinogen, Ur 0.2 0.2 - 1.0 mg/dL   Nitrite, UA Negative Negative   Microscopic Examination See below:    Urinalysis Reflex Comment   Hgb A1c w/o eAG  Result Value Ref Range   Hgb A1c MFr Bld 4.8 4.8 - 5.6 %  Urine Culture, Routine  Result Value Ref Range   Urine Culture, Routine Final report    Urine Culture result 1 No growth       Assessment & Plan:   Problem List Items Addressed This Visit    None    Visit Diagnoses    Lower abdominal pain    -  Primary   ? ovarian cyst or issues with endometriosis. Order for pelvic ultrasound placed- to be  done next week on Wednesday, she has appointment with GYN.   Relevant Orders   US Pelvis Complete   Fever, unspecified fever cause       WBC back to normal. No fever today, ? viral illness. Continue to mointor closely.   Relevant Orders   CBC With Differential/Platelet   Jittery feeling       Improved. Happened again last night. Of unclear etiology, ?vertigo- epley's manuver and meclizine given. Warning signs discussed.    Chronic migraine without aura without status migrainosus, not intractable       Unclear if this is the issue, headache starts after funny feeling, continue to monitor. Call with any concerns.        Follow up plan: Return Tomorrow.

## 2016-11-20 NOTE — Telephone Encounter (Signed)
Patient called requesting to speak directly with Dr Laural Benes regarding some questions she has about the issues she has going on.  I explained Dr Laural Benes was still seeing patients and I was not sure if she could return her call this afternoon.  Patient had an appt for tomorrow but called earlier today and cancelled the appt  [phone]  Thanks

## 2016-11-20 NOTE — Telephone Encounter (Signed)
Pt decided to make an appt instead of having a message sent.

## 2016-11-21 ENCOUNTER — Ambulatory Visit: Payer: Medicaid Other | Admitting: Family Medicine

## 2016-11-21 NOTE — Telephone Encounter (Signed)
Returned patient's call.   Patient wonders if this could have something to do with her pituitary and would like to have her pituitary checked. Order for prolactin placed. She can come get it done whenever she'd like.   She notes that she is feeling much better today.

## 2016-11-26 ENCOUNTER — Ambulatory Visit
Admission: RE | Admit: 2016-11-26 | Discharge: 2016-11-26 | Disposition: A | Discharge status: Home / Self Care | Payer: Medicaid Other | Source: Ambulatory Visit | Attending: Family Medicine | Admitting: Family Medicine

## 2016-11-26 DIAGNOSIS — R1030 Lower abdominal pain, unspecified: Secondary | ICD-10-CM | POA: Insufficient documentation

## 2016-11-28 ENCOUNTER — Encounter: Payer: Self-pay | Admitting: Obstetrics and Gynecology

## 2016-11-28 ENCOUNTER — Ambulatory Visit (INDEPENDENT_AMBULATORY_CARE_PROVIDER_SITE_OTHER): Payer: Medicaid Other | Admitting: Obstetrics and Gynecology

## 2016-11-28 VITALS — BP 112/65 | HR 80 | Ht 66.0 in | Wt 138.0 lb

## 2016-11-28 DIAGNOSIS — R102 Pelvic and perineal pain: Secondary | ICD-10-CM | POA: Diagnosis not present

## 2016-11-28 DIAGNOSIS — Z9889 Other specified postprocedural states: Secondary | ICD-10-CM | POA: Diagnosis not present

## 2016-11-28 DIAGNOSIS — N809 Endometriosis, unspecified: Secondary | ICD-10-CM | POA: Diagnosis not present

## 2016-11-28 DIAGNOSIS — N852 Hypertrophy of uterus: Secondary | ICD-10-CM | POA: Diagnosis not present

## 2016-11-28 NOTE — Progress Notes (Signed)
GYN ENCOUNTER NOTE  Subjective:       Beverly Haley is a 45 y.o. G23P3003 female is here for gynecologic evaluation of the following issues:   1. Abdominal Pain  Beverly Haley presented today with chief complaint of worsening abdominal pain with headaches. She has history of endometriosis with endometrial ablation in November and has had constant dull pain since; however, on Tuesday 4/17 she had sudden, severe abdominal pain, worse on the right. Other associated symptoms at this time was low grade fever (100.8 F), dizziness, headache, and nausea with vomiting which lasted 2 days. Since then, the only symptoms persisting are abdominal pain, 8/10 today, and headache 6/10. Has been taking Tylenol without much relief. She hasn't had a period since ablation, but connects worsening of pain to be around the time her period normally was. Denies current fever, chills, night sweats, dizziness, nausea, and vomiting.   Gynecologic History No LMP recorded. Patient has had an ablation.  Obstetric History OB History  Gravida Para Term Preterm AB Living  SAB TAB Ectopic Multiple Live Births          3    # Outcome Date GA Lbr Len/2nd Weight Sex Delivery Anes PTL Lv  3 Term 2006   7 lb 9.6 oz (3.447 kg) F Vag-Spont   LIV  2 Term 1999   8 lb 14.4 oz (4.037 kg) M Vag-Spont   LIV  1 Term 1995   7 lb 3.2 oz (3.266 kg) F Vag-Spont   LIV      Past Medical History:  Diagnosis Date  . Hip pain   . Hyperlipidemia   . Migraine   . 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 APPENDECTOMY    . BREAST SURGERY    . HYSTEROSCOPY WITH NOVASURE N/A 06/16/2016   Procedure: HYSTEROSCOPY WITH NOVASURE;  Surgeon: Herold Harms, MD;  Location: [facility];  Service: Gynecology;  Laterality: N/A;  . LAPAROSCOPY N/A 06/16/2016   Procedure: LAPAROSCOPY DIAGNOSTIC WITH BIOPSIES;  Surgeon: Herold Harms, MD;  Location: [facility];  Service: Gynecology;  Laterality: N/A;  . PLACEMENT OF BREAST IMPLANTS      No current outpatient prescriptions on file prior to visit.   No current facility-administered medications on file prior to visit.     Allergies  Allergen Reactions  . Peanut-Containing Drug Products Other (See Comments)    "severe headache"    Social History   Social History  . Marital status: Married    Spouse name: N/A  . Number of children: N/A  . Years of education: N/A   Occupational History  . Not on file.   Social History Main Topics  . Smoking status: Never Smoker  . Smokeless tobacco: Never Used  . Alcohol use 4.2 oz/week    7 Shots of liquor per week  . Drug use: No  . Sexual activity: Yes    Birth control/ protection: None     Comment: Spouse had vasectomy   Other Topics Concern  . Not on file   Social History Narrative  . No narrative on file    Family History  Problem Relation Age of Onset  . Migraines Mother   . Hyperlipidemia Father   . Hypertension Father   . Breast cancer Neg Hx   . Ovarian cancer  Neg Hx   . Colon cancer Neg Hx   . Diabetes Neg Hx     The following portions of the patient's history were reviewed and updated as appropriate: allergies, current medications, past family history, past medical history, past social history, past surgical history and problem list.  Review of Systems Review of Systems  Constitutional: Negative for chills and fever.  HENT: Negative for tinnitus.   Eyes: Negative for blurred vision and double vision.  Respiratory: Negative for shortness of breath.   Cardiovascular: Negative for chest pain and palpitations.  Gastrointestinal: Positive for abdominal pain. Negative for constipation, nausea and vomiting.  Genitourinary: Negative for dysuria, frequency, hematuria and urgency.  Musculoskeletal: Negative for joint pain.  Neurological: Positive for headaches. Negative for dizziness,  sensory change, loss of consciousness and weakness.     Objective:   BP 112/65   Pulse 80   Ht  (1.676 m)   Wt 138 lb (62.6 kg)   BMI 22.27 kg/m  CONSTITUTIONAL: Well-developed, well-nourished female in no acute distress.  SKIN: Skin is warm and dry. No rash noted. Not diaphoretic. No erythema. No pallor. NEUROLGIC: Alert and oriented PSYCHIATRIC: Normal mood and affect. Normal behavior. Normal judgment and thought content. CARDIOVASCULAR:Not Examined RESPIRATORY: Not Examined BREASTS: Not Examined ABDOMEN: Soft, non distended. Tender across lower abdomen, worse on right.  PELVIC:  External Genitalia: Normal  BUS: Normal  Vagina: Normal  Cervix: Cervical motion tenderness 2/4  Uterus: Uterine tenderness 2/4; midplane, top normal size  Adnexa: No masses; nontender  RV: External exam normal  Bladder: Nontender MUSCULOSKELETAL: Not examined    Assessment:   1. Bulky or enlarged uterus  2. Pelvic pain Chronic with history of endometriosis. Constant after ablation with acute worsening on 4/17.   3. History of endometrial ablation  4. Endometriosis Constant lower abdominal pain with worsening right lower quadrant pain since 4/17.        Plan:   1. Schedule LAVH and bilateral salpingectomy with right oophorectomy 2. Schedule pre-op appointment 1 week before surgery  3. May continue Tylenol for pain control. Counseled to take no more than 4g/day.   Beverly Memos, PA-S [facility]  Herold Harms, MD   I have seen, interviewed, and examined the patient in conjunction with the [facility]. student and affirm the diagnosis and management plan. Martin A. DeFrancesco, MD, FACOG   Note: This dictation was prepared with Dragon dictation along with smaller phrase technology. Any transcriptional errors that result from this process are unintentional.  11/28/2016

## 2016-11-28 NOTE — Patient Instructions (Addendum)
1. LAVH bilateral salpingectomy and RSO is to be scheduled 2. Return for preop appointment the week before surgery 3. Tylenol and nonsteroidal medications choice as needed for pain relief

## 2016-12-02 ENCOUNTER — Ambulatory Visit (INDEPENDENT_AMBULATORY_CARE_PROVIDER_SITE_OTHER): Payer: Medicaid Other | Admitting: Obstetrics and Gynecology

## 2016-12-02 ENCOUNTER — Encounter: Payer: Self-pay | Admitting: Obstetrics and Gynecology

## 2016-12-02 VITALS — BP 123/78 | HR 71 | Ht 66.0 in | Wt 138.5 lb

## 2016-12-02 DIAGNOSIS — R102 Pelvic and perineal pain: Secondary | ICD-10-CM

## 2016-12-02 DIAGNOSIS — N852 Hypertrophy of uterus: Secondary | ICD-10-CM

## 2016-12-02 DIAGNOSIS — N809 Endometriosis, unspecified: Secondary | ICD-10-CM

## 2016-12-02 DIAGNOSIS — N920 Excessive and frequent menstruation with regular cycle: Secondary | ICD-10-CM

## 2016-12-02 DIAGNOSIS — Z9889 Other specified postprocedural states: Secondary | ICD-10-CM

## 2016-12-02 DIAGNOSIS — Z01818 Encounter for other preprocedural examination: Secondary | ICD-10-CM

## 2016-12-02 NOTE — Patient Instructions (Signed)
1.  Return for postop check 1 week after surgery 

## 2016-12-02 NOTE — Progress Notes (Signed)
Subjective:  PREOPERATIVE HISTORY AND PHYSICAL   Date of surgery: 12/08/2016 Diagnosis: 1. Symptomatic endometriosis 2. Status post endometrial ablation Procedure: LAVH bilateral salpingectomy with right oophorectomy   Patient is a 46 y.o. [id number] scheduled for LAVH bilateral salpingectomy with right oophorectomy on 12/08/2016. Patient has menorrhagia with regular cycle. She has long history of dysmenorrhea. On clinical exam uterus was bulky (slightly enlarged. Laparoscopy on 06/16/2016 demonstrated probable endometriosis. She underwent endometrial ablation at that time. She has continued to have pelvic pain since her surgery, and it has exacerbated recently she has not had any bleeding since her ablation..  Pertinent Gynecological History: History of dysmenorrhea Status post endometrial ablation History of endometriosis  OB History    Gravida Para Term Preterm AB Living   3 3 3     3    SAB TAB Ectopic Multiple Live Births           3     No LMP recorded. Patient has had an ablation.    Past Medical History:  Diagnosis Date  . Hip pain   . Hyperlipidemia   . Migraine   . 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 APPENDECTOMY    . BREAST SURGERY    . HYSTEROSCOPY WITH NOVASURE N/A 06/16/2016   Procedure: HYSTEROSCOPY WITH NOVASURE;  Surgeon: Herold HarmsMartin A Sakoya Win, MD;  Location: [facility];  Service: Gynecology;  Laterality: N/A;  . LAPAROSCOPY N/A 06/16/2016   Procedure: LAPAROSCOPY DIAGNOSTIC WITH BIOPSIES;  Surgeon: Herold HarmsMartin A Keani Gotcher, MD;  Location: [facility];  Service: Gynecology;  Laterality: N/A;  . PLACEMENT OF BREAST IMPLANTS      OB History  Gravida Para Term Preterm AB Living  3 3 3     3   SAB TAB Ectopic Multiple Live Births          3    # Outcome Date GA Lbr Len/2nd Weight Sex Delivery Anes PTL Lv  3 Term 2006   7 lb 9.6 oz (3.447 kg) F Vag-Spont   LIV   2 Term 1999   8 lb 14.4 oz (4.037 kg) M Vag-Spont   LIV  1 Term 1995   7 lb 3.2 oz (3.266 kg) F Vag-Spont   LIV      Social History   Social History  . Marital status: Married    Spouse name: N/A  . Number of children: N/A  . Years of education: N/A   Social History Main Topics  . Smoking status: Never Smoker  . Smokeless tobacco: Never Used  . Alcohol use 4.2 oz/week    7 Shots of liquor per week  . Drug use: No  . Sexual activity: Yes    Birth control/ protection: None     Comment: Spouse had vasectomy   Other Topics Concern  . None   Social History Narrative  . None    Family History  Problem Relation Age of Onset  . Migraines Mother   . Hyperlipidemia Father   . Hypertension Father   . Breast cancer Neg Hx   . Ovarian cancer Neg Hx   . Colon cancer Neg Hx   . Diabetes Neg Hx      (Not in a hospital admission)  Allergies  Allergen Reactions  . Peanut-Containing Drug Products Other (See Comments)    "severe headache"    Review of Systems Constitutional: No recent fever/chills/sweats  Respiratory: No recent cough/bronchitis Cardiovascular: No chest pain Gastrointestinal: No recent nausea/vomiting/diarrhea Genitourinary: No UTI symptoms Hematologic/lymphatic:No history of coagulopathy or recent blood thinner use    Objective:    BP 123/78   Pulse 71   Ht  (1.676 m)   Wt 138 lb 8 oz (62.8 kg)   BMI 22.35 kg/m   General:   Normal  Skin:   normal  HEENT:  Normal  Neck:  Supple without Adenopathy or Thyromegaly  Lungs:   Heart:              Breasts:   Abdomen:  Pelvis:  M/S   Extremeties:  Neuro:    clear to auscultation bilaterally   Normal without murmur   Not Examined   soft, non-tender; bowel sounds normal; no masses,  no organomegaly   Exam deferred to OR  No CVAT  Warm/Dry   Normal        11/28/2016 PELVIC:             External Genitalia: Normal             BUS: Normal             Vagina: Normal             Cervix:  Cervical motion tenderness 2/4             Uterus: Uterine tenderness 2/4; midplane, top normal size             Adnexa: No masses; nontender             RV: External exam normal             Bladder: Nontender  Assessment:    1. Bulky or enlarged uterus  2. Pelvic pain Chronic with history of endometriosis. Constant after ablation with acute worsening on 4/17.   3. History of endometrial ablation  4. Endometriosis Constant lower abdominal pain with worsening right lower quadrant pain since 4/17.         Plan:  LAVH bilateral salpingectomy and right oophorectomy   Preop counseling: Patient is to undergo LAVH bilateral salpingectomy with right oophorectomy on 12/08/2016. She is understanding of the planned procedure. She is aware of and is accepting of all surgical risks which include but are not limited to bleeding, infection, pelvic organ injury with need for repair, blood clot disorders, anesthesia risks, etc. All questions have been answered. Informed consent is given. Patient is ready and willing to proceed with surgery as scheduled.  Herold Harms, MD  Note: This dictation was prepared with Dragon dictation along with smaller phrase technology. Any transcriptional errors that result from this process are unintentional.

## 2016-12-02 NOTE — H&P (Signed)
Subjective:  PREOPERATIVE HISTORY AND PHYSICAL   Date of surgery: 12/08/2016 Diagnosis: 1. Symptomatic endometriosis 2. Status post endometrial ablation Procedure: LAVH bilateral salpingectomy with right oophorectomy   Patient is a 46 y.o. G3P308female scheduled for LAVH bilateral salpingectomy with right oophorectomy on 12/08/2016. Patient has menorrhagia with regular cycle. She has long history of dysmenorrhea. On clinical exam uterus was bulky (slightly enlarged. Laparoscopy on 06/16/2016 demonstrated probable endometriosis. She underwent endometrial ablation at that time. She has continued to have pelvic pain since her surgery, and it has exacerbated recently she has not had any bleeding since her ablation..  Pertinent Gynecological History: History of dysmenorrhea Status post endometrial ablation History of endometriosis  OB History    Gravida Para Term Preterm AB Living   SAB TAB Ectopic Multiple Live Births           3     No LMP recorded. Patient has had an ablation.    Past Medical History:  Diagnosis Date  . Hip pain   . Hyperlipidemia   . Migraine   . 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 APPENDECTOMY    . BREAST SURGERY    . HYSTEROSCOPY WITH NOVASURE N/A 06/16/2016   Procedure: HYSTEROSCOPY WITH NOVASURE;  Surgeon: Herold Harms, MD;  Location: [facility] ORS;  Service: Gynecology;  Laterality: N/A;  . LAPAROSCOPY N/A 06/16/2016   Procedure: LAPAROSCOPY DIAGNOSTIC WITH BIOPSIES;  Surgeon: Herold Harms, MD;  Location: [facility] ORS;  Service: Gynecology;  Laterality: N/A;  . PLACEMENT OF BREAST IMPLANTS      OB History  Gravida Para Term Preterm AB Living  SAB TAB Ectopic Multiple Live Births          3    # Outcome Date GA Lbr Len/2nd Weight Sex Delivery Anes PTL Lv  3 Term 2006   7 lb 9.6 oz (3.447 kg) F Vag-Spont   LIV   2 Term 1999   8 lb 14.4 oz (4.037 kg) M Vag-Spont   LIV  1 Term 1995   7 lb 3.2 oz (3.266 kg) F Vag-Spont   LIV      Social History   Social History  . Marital status: Married    Spouse name: N/A  . Number of children: N/A  . Years of education: N/A   Social History Main Topics  . Smoking status: Never Smoker  . Smokeless tobacco: Never Used  . Alcohol use 4.2 oz/week    7 Shots of liquor per week  . Drug use: No  . Sexual activity: Yes    Birth control/ protection: None     Comment: Spouse had vasectomy   Other Topics Concern  . None   Social History Narrative  . None    Family History  Problem Relation Age of Onset  . Migraines Mother   . Hyperlipidemia Father   . Hypertension Father   . Breast cancer Neg Hx   . Ovarian cancer Neg Hx   . Colon cancer Neg Hx   . Diabetes Neg Hx      (Not in a hospital admission)  Allergies  Allergen Reactions  . Peanut-Containing Drug Products Other (See Comments)    "severe headache"    Review of Systems Constitutional: No recent fever/chills/sweats  Respiratory: No recent cough/bronchitis Cardiovascular: No chest pain Gastrointestinal: No recent nausea/vomiting/diarrhea Genitourinary: No UTI symptoms Hematologic/lymphatic:No history of coagulopathy or recent blood thinner use    Objective:    BP 123/78   Pulse 71   Ht  (1.676 m)   Wt 138 lb 8 oz (62.8 kg)   BMI 22.35 kg/m   General:   Normal  Skin:   normal  HEENT:  Normal  Neck:  Supple without Adenopathy or Thyromegaly  Lungs:   Heart:              Breasts:   Abdomen:  Pelvis:  M/S   Extremeties:  Neuro:    clear to auscultation bilaterally   Normal without murmur   Not Examined   soft, non-tender; bowel sounds normal; no masses,  no organomegaly   Exam deferred to OR  No CVAT  Warm/Dry   Normal        11/28/2016 PELVIC:             External Genitalia: Normal             BUS: Normal             Vagina: Normal             Cervix:  Cervical motion tenderness 2/4             Uterus: Uterine tenderness 2/4; midplane, top normal size             Adnexa: No masses; nontender             RV: External exam normal             Bladder: Nontender  Assessment:    1. Bulky or enlarged uterus  2. Pelvic pain Chronic with history of endometriosis. Constant after ablation with acute worsening on 4/17.   3. History of endometrial ablation  4. Endometriosis Constant lower abdominal pain with worsening right lower quadrant pain since 4/17.         Plan:  LAVH bilateral salpingectomy and right oophorectomy   Preop counseling: Patient is to undergo LAVH bilateral salpingectomy with right oophorectomy on 12/08/2016. She is understanding of the planned procedure. She is aware of and is accepting of all surgical risks which include but are not limited to bleeding, infection, pelvic organ injury with need for repair, blood clot disorders, anesthesia risks, etc. All questions have been answered. Informed consent is given. Patient is ready and willing to proceed with surgery as scheduled.  Herold Harms, MD  Note: This dictation was prepared with Dragon dictation along with smaller phrase technology. Any transcriptional errors that result from this process are unintentional.

## 2016-12-03 ENCOUNTER — Encounter
Admission: RE | Admit: 2016-12-03 | Discharge: 2016-12-03 | Disposition: A | Discharge status: Home / Self Care | Payer: Medicaid Other | Source: Ambulatory Visit | Attending: Obstetrics and Gynecology | Admitting: Obstetrics and Gynecology

## 2016-12-03 NOTE — Patient Instructions (Signed)
  Your procedure is scheduled on: 12-08-16 (Monday) Report to Same Day Surgery 2nd floor medical mall [facility] Entrance-take elevator on left to 2nd floor.  Check in with surgery information desk.) To find out your arrival time please call [phone] between 1PM - 3PM on 12-05-16 (Friday)  Remember: Instructions that are not followed completely may result in serious medical risk, up to and including death, or upon the discretion of your surgeon and anesthesiologist your surgery may need to be rescheduled.    _x___ 1. Do not eat food or drink liquids after midnight. No gum chewing or hard candies.     __x__ 2. No Alcohol for 24 hours before or after surgery.   __x__3. No Smoking for 24 prior to surgery.   ____  4. Bring all medications with you on the day of surgery if instructed.    __x__ 5. Notify your doctor if there is any change in your medical condition     (cold, fever, infections).     Do not wear jewelry, make-up, hairpins, clips or nail polish.  Do not wear lotions, powders, or perfumes. You may wear deodorant.  Do not shave 48 hours prior to surgery. Men may shave face and neck.  Do not bring valuables to the hospital.    [facility] is not responsible for any belongings or valuables.               Contacts, dentures or bridgework may not be worn into surgery.  Leave your suitcase in the car. After surgery it may be brought to your room.  For patients admitted to the hospital, discharge time is determined by your treatment team.   Patients discharged the day of surgery will not be allowed to drive home.  You will need someone to drive you home and stay with you the night of your procedure.    Please read over the following fact sheets that you were given:    ____ Take anti-hypertensive (unless it includes a diuretic), cardiac, seizure, asthma,     anti-reflux and psychiatric medicines. These include:  1. NONE  2.  3.  4.  5.  6.  ____Fleets enema or Magnesium  Citrate as directed.   _x___ Use CHG Soap or sage wipes as directed on instruction sheet   ____ Use inhalers on the day of surgery and bring to hospital day of surgery  ____ Stop Metformin and Janumet 2 days prior to surgery.    ____ Take 1/2 of usual insulin dose the night before surgery and none on the morning surgery.   _x___ Follow recommendations from Cardiologist, Pulmonologist or PCP regarding stopping Aspirin, Coumadin, Pllavix ,Eliquis, Effient, or Pradaxa, and Pletal.  X____Stop Anti-inflammatories such as Advil, Aleve, Ibuprofen, Motrin, Naproxen, Naprosyn, Goodies powders, BC POWDERS, or aspirin products NOW-OK to take Tylenol    _x___ Stop supplements until after surgery-STOP CAYENNE AND MULTIVITAMIN PACKET NOW-MAY RESUME AFTER SURGERY    ____ Bring C-Pap to the hospital.

## 2016-12-04 ENCOUNTER — Encounter
Admission: RE | Admit: 2016-12-04 | Discharge: 2016-12-04 | Disposition: A | Discharge status: Home / Self Care | Payer: Medicaid Other | Source: Ambulatory Visit | Attending: Obstetrics and Gynecology | Admitting: Obstetrics and Gynecology

## 2016-12-04 DIAGNOSIS — Z01818 Encounter for other preprocedural examination: Secondary | ICD-10-CM | POA: Diagnosis present

## 2016-12-04 DIAGNOSIS — Z01812 Encounter for preprocedural laboratory examination: Secondary | ICD-10-CM | POA: Insufficient documentation

## 2016-12-04 LAB — CBC WITH DIFFERENTIAL/PLATELET
BASOS PCT: 0 %
Basophils Absolute: 0 10*3/uL (ref 0–0.1)
Eosinophils Absolute: 0.1 10*3/uL (ref 0–0.7)
Eosinophils Relative: 2 %
HEMATOCRIT: 36.3 % (ref 35.0–47.0)
HEMOGLOBIN: 12.6 g/dL (ref 12.0–16.0)
LYMPHS PCT: 18 %
Lymphs Abs: 1.3 10*3/uL (ref 1.0–3.6)
MCH: 31.5 pg (ref 26.0–34.0)
MCHC: 34.7 g/dL (ref 32.0–36.0)
MCV: 90.8 fL (ref 80.0–100.0)
MONO ABS: 0.5 10*3/uL (ref 0.2–0.9)
Monocytes Relative: 6 %
NEUTROS ABS: 5.3 10*3/uL (ref 1.4–6.5)
NEUTROS PCT: 74 %
Platelets: 244 10*3/uL (ref 150–440)
RBC: 4 MIL/uL (ref 3.80–5.20)
RDW: 14.2 % (ref 11.5–14.5)
WBC: 7.2 10*3/uL (ref 3.6–11.0)

## 2016-12-04 LAB — TYPE AND SCREEN
ABO/RH(D): O POS
ANTIBODY SCREEN: NEGATIVE

## 2016-12-04 LAB — RAPID HIV SCREEN (HIV 1/2 AB+AG)
HIV 1/2 ANTIBODIES: NONREACTIVE
HIV-1 P24 Antigen - HIV24: NONREACTIVE

## 2016-12-05 LAB — RPR: RPR Ser Ql: NONREACTIVE

## 2016-12-07 MED ORDER — CEFAZOLIN SODIUM-DEXTROSE 2-4 GM/100ML-% IV SOLN
2.0000 g | INTRAVENOUS | Status: AC
  Administered 2016-12-08: 2 g via INTRAVENOUS

## 2016-12-08 ENCOUNTER — Observation Stay
Admission: RE | Admit: 2016-12-08 | Discharge: 2016-12-09 | Disposition: A | Discharge status: Home / Self Care | Payer: Medicaid Other | Source: Ambulatory Visit | Attending: Obstetrics and Gynecology | Admitting: Obstetrics and Gynecology

## 2016-12-08 ENCOUNTER — Ambulatory Visit: Payer: Medicaid Other | Admitting: Anesthesiology

## 2016-12-08 ENCOUNTER — Encounter: Payer: Self-pay | Admitting: *Deleted

## 2016-12-08 ENCOUNTER — Encounter
Admission: RE | Disposition: A | Discharge status: Home / Self Care | Payer: Self-pay | Source: Ambulatory Visit | Attending: Obstetrics and Gynecology

## 2016-12-08 DIAGNOSIS — Z820 Family history of epilepsy and other diseases of the nervous system: Secondary | ICD-10-CM | POA: Insufficient documentation

## 2016-12-08 DIAGNOSIS — N8311 Corpus luteum cyst of right ovary: Secondary | ICD-10-CM | POA: Insufficient documentation

## 2016-12-08 DIAGNOSIS — N72 Inflammatory disease of cervix uteri: Secondary | ICD-10-CM | POA: Diagnosis not present

## 2016-12-08 DIAGNOSIS — Z8249 Family history of ischemic heart disease and other diseases of the circulatory system: Secondary | ICD-10-CM | POA: Diagnosis not present

## 2016-12-08 DIAGNOSIS — Z91010 Allergy to peanuts: Secondary | ICD-10-CM | POA: Insufficient documentation

## 2016-12-08 DIAGNOSIS — E785 Hyperlipidemia, unspecified: Secondary | ICD-10-CM | POA: Diagnosis not present

## 2016-12-08 DIAGNOSIS — E8940 Asymptomatic postprocedural ovarian failure: Secondary | ICD-10-CM

## 2016-12-08 DIAGNOSIS — Z90710 Acquired absence of both cervix and uterus: Secondary | ICD-10-CM

## 2016-12-08 DIAGNOSIS — N838 Other noninflammatory disorders of ovary, fallopian tube and broad ligament: Secondary | ICD-10-CM | POA: Insufficient documentation

## 2016-12-08 DIAGNOSIS — G43909 Migraine, unspecified, not intractable, without status migrainosus: Secondary | ICD-10-CM | POA: Insufficient documentation

## 2016-12-08 DIAGNOSIS — N809 Endometriosis, unspecified: Secondary | ICD-10-CM | POA: Insufficient documentation

## 2016-12-08 HISTORY — PX: LAPAROSCOPIC VAGINAL HYSTERECTOMY WITH SALPINGECTOMY: SHX6680

## 2016-12-08 LAB — POCT PREGNANCY, URINE: PREG TEST UR: NEGATIVE

## 2016-12-08 SURGERY — HYSTERECTOMY, VAGINAL, LAPAROSCOPY-ASSISTED, WITH SALPINGECTOMY
Anesthesia: General

## 2016-12-08 MED ORDER — ACETAMINOPHEN 10 MG/ML IV SOLN
INTRAVENOUS | Status: DC | PRN
  Administered 2016-12-08: 1000 mg via INTRAVENOUS

## 2016-12-08 MED ORDER — FAMOTIDINE 20 MG PO TABS
20.0000 mg | ORAL_TABLET | Freq: Once | ORAL | Status: AC
  Administered 2016-12-08 (×2): 20 mg via ORAL

## 2016-12-08 MED ORDER — SIMETHICONE 80 MG PO CHEW
80.0000 mg | CHEWABLE_TABLET | Freq: Four times a day (QID) | ORAL | Status: DC | PRN
  Administered 2016-12-08 – 2016-12-09 (×2): 80 mg via ORAL
  Filled 2016-12-08 (×2): qty 1

## 2016-12-08 MED ORDER — FENTANYL CITRATE (PF) 100 MCG/2ML IJ SOLN
INTRAMUSCULAR | Status: AC
  Filled 2016-12-08: qty 2

## 2016-12-08 MED ORDER — ACETAMINOPHEN 325 MG PO TABS: 650.0000 mg | ORAL_TABLET | ORAL | Status: DC | PRN

## 2016-12-08 MED ORDER — HYDROMORPHONE HCL 1 MG/ML IJ SOLN
0.5000 mg | INTRAMUSCULAR | Status: DC | PRN
  Administered 2016-12-08 (×3): 0.5 mg via INTRAVENOUS

## 2016-12-08 MED ORDER — PROPOFOL 10 MG/ML IV BOLUS
INTRAVENOUS | Status: AC
  Filled 2016-12-08: qty 60

## 2016-12-08 MED ORDER — LACTATED RINGERS IV SOLN: INTRAVENOUS | Status: DC

## 2016-12-08 MED ORDER — MIDAZOLAM HCL 2 MG/2ML IJ SOLN
1.0000 mg | Freq: Once | INTRAMUSCULAR | Status: AC
  Administered 2016-12-08: 1 mg via INTRAVENOUS

## 2016-12-08 MED ORDER — FENTANYL CITRATE (PF) 100 MCG/2ML IJ SOLN
INTRAMUSCULAR | Status: AC
  Administered 2016-12-08: 25 ug via INTRAVENOUS
  Filled 2016-12-08: qty 2

## 2016-12-08 MED ORDER — KETOROLAC TROMETHAMINE 30 MG/ML IJ SOLN
INTRAMUSCULAR | Status: DC | PRN
  Administered 2016-12-08: 30 mg via INTRAVENOUS

## 2016-12-08 MED ORDER — MIDAZOLAM HCL 2 MG/2ML IJ SOLN
INTRAMUSCULAR | Status: DC | PRN
  Administered 2016-12-08: 2 mg via INTRAVENOUS

## 2016-12-08 MED ORDER — PROPOFOL 10 MG/ML IV BOLUS
INTRAVENOUS | Status: DC | PRN
  Administered 2016-12-08: 150 mg via INTRAVENOUS

## 2016-12-08 MED ORDER — MIDAZOLAM HCL 2 MG/2ML IJ SOLN
INTRAMUSCULAR | Status: AC
  Filled 2016-12-08: qty 2

## 2016-12-08 MED ORDER — ONDANSETRON HCL 4 MG/2ML IJ SOLN: 4.0000 mg | Freq: Once | INTRAMUSCULAR | Status: DC | PRN

## 2016-12-08 MED ORDER — DEXAMETHASONE SODIUM PHOSPHATE 10 MG/ML IJ SOLN
INTRAMUSCULAR | Status: DC | PRN
  Administered 2016-12-08: 10 mg via INTRAVENOUS

## 2016-12-08 MED ORDER — GLYCOPYRROLATE 0.2 MG/ML IJ SOLN
INTRAMUSCULAR | Status: AC
  Filled 2016-12-08: qty 3

## 2016-12-08 MED ORDER — PROPOFOL 10 MG/ML IV BOLUS
INTRAVENOUS | Status: AC
  Filled 2016-12-08: qty 20

## 2016-12-08 MED ORDER — MORPHINE SULFATE (PF) 2 MG/ML IV SOLN
1.0000 mg | INTRAVENOUS | Status: DC | PRN
  Administered 2016-12-08 – 2016-12-09 (×4): 2 mg via INTRAVENOUS
  Filled 2016-12-08 (×4): qty 1

## 2016-12-08 MED ORDER — FENTANYL CITRATE (PF) 100 MCG/2ML IJ SOLN
INTRAMUSCULAR | Status: DC | PRN
  Administered 2016-12-08: 50 ug via INTRAVENOUS
  Administered 2016-12-08: 100 ug via INTRAVENOUS
  Administered 2016-12-08: 50 ug via INTRAVENOUS

## 2016-12-08 MED ORDER — LACTATED RINGERS IV SOLN
INTRAVENOUS | Status: DC
  Administered 2016-12-08 – 2016-12-09 (×3): via INTRAVENOUS

## 2016-12-08 MED ORDER — LIDOCAINE HCL 2 % IJ SOLN
INTRAMUSCULAR | Status: AC
  Filled 2016-12-08: qty 10

## 2016-12-08 MED ORDER — BISACODYL 10 MG RE SUPP: 10.0000 mg | Freq: Every day | RECTAL | Status: DC | PRN

## 2016-12-08 MED ORDER — LIDOCAINE HCL (CARDIAC) 20 MG/ML IV SOLN
INTRAVENOUS | Status: DC | PRN
  Administered 2016-12-08: 60 mg via INTRAVENOUS

## 2016-12-08 MED ORDER — ROCURONIUM BROMIDE 100 MG/10ML IV SOLN
INTRAVENOUS | Status: DC | PRN
  Administered 2016-12-08: 10 mg via INTRAVENOUS
  Administered 2016-12-08: 50 mg via INTRAVENOUS
  Administered 2016-12-08: 10 mg via INTRAVENOUS
  Administered 2016-12-08: 30 mg via INTRAVENOUS

## 2016-12-08 MED ORDER — SUGAMMADEX SODIUM 200 MG/2ML IV SOLN
INTRAVENOUS | Status: AC
  Filled 2016-12-08: qty 2

## 2016-12-08 MED ORDER — FENTANYL CITRATE (PF) 100 MCG/2ML IJ SOLN
25.0000 ug | INTRAMUSCULAR | Status: DC | PRN
  Administered 2016-12-08 (×4): 25 ug via INTRAVENOUS

## 2016-12-08 MED ORDER — DOCUSATE SODIUM 100 MG PO CAPS: 100.0000 mg | ORAL_CAPSULE | Freq: Two times a day (BID) | ORAL | Status: DC

## 2016-12-08 MED ORDER — ONDANSETRON HCL 4 MG/2ML IJ SOLN
INTRAMUSCULAR | Status: AC
  Filled 2016-12-08: qty 2

## 2016-12-08 MED ORDER — SUGAMMADEX SODIUM 200 MG/2ML IV SOLN
INTRAVENOUS | Status: DC | PRN
  Administered 2016-12-08: 200 mg via INTRAVENOUS

## 2016-12-08 MED ORDER — ACETAMINOPHEN NICU IV SYRINGE 10 MG/ML
INTRAVENOUS | Status: AC
  Filled 2016-12-08: qty 1

## 2016-12-08 MED ORDER — ONDANSETRON HCL 4 MG/2ML IJ SOLN
INTRAMUSCULAR | Status: DC | PRN
  Administered 2016-12-08: 4 mg via INTRAVENOUS

## 2016-12-08 MED ORDER — FAMOTIDINE 20 MG PO TABS
ORAL_TABLET | ORAL | Status: AC
Start: 2016-12-08 — End: 2016-12-08
  Administered 2016-12-08: 20 mg via ORAL
  Filled 2016-12-08: qty 1

## 2016-12-08 MED ORDER — DIPHENHYDRAMINE HCL 25 MG PO CAPS
25.0000 mg | ORAL_CAPSULE | Freq: Four times a day (QID) | ORAL | Status: DC | PRN
  Administered 2016-12-09 (×2): 25 mg via ORAL
  Filled 2016-12-08 (×2): qty 1

## 2016-12-08 MED ORDER — HYDROMORPHONE HCL 1 MG/ML IJ SOLN
INTRAMUSCULAR | Status: AC
  Administered 2016-12-08: 0.5 mg via INTRAVENOUS
  Filled 2016-12-08: qty 1

## 2016-12-08 MED ORDER — OXYCODONE-ACETAMINOPHEN 5-325 MG PO TABS
1.0000 | ORAL_TABLET | ORAL | Status: DC | PRN
  Administered 2016-12-08: 1 via ORAL
  Administered 2016-12-08 – 2016-12-09 (×5): 2 via ORAL
  Filled 2016-12-08 (×6): qty 2

## 2016-12-08 MED ORDER — CEFAZOLIN SODIUM-DEXTROSE 2-4 GM/100ML-% IV SOLN
INTRAVENOUS | Status: AC
  Administered 2016-12-08: 2 g via INTRAVENOUS
  Filled 2016-12-08: qty 100

## 2016-12-08 MED ORDER — KETOROLAC TROMETHAMINE 30 MG/ML IJ SOLN
30.0000 mg | Freq: Four times a day (QID) | INTRAMUSCULAR | Status: DC
  Administered 2016-12-08 – 2016-12-09 (×4): 30 mg via INTRAVENOUS
  Filled 2016-12-08 (×5): qty 1

## 2016-12-08 MED ORDER — ONDANSETRON HCL 4 MG/2ML IJ SOLN
4.0000 mg | INTRAMUSCULAR | Status: DC | PRN
  Administered 2016-12-08 – 2016-12-09 (×3): 4 mg via INTRAVENOUS
  Filled 2016-12-08 (×3): qty 2

## 2016-12-08 MED ORDER — KETOROLAC TROMETHAMINE 30 MG/ML IJ SOLN: 30.0000 mg | Freq: Four times a day (QID) | INTRAMUSCULAR | Status: DC

## 2016-12-08 MED ORDER — PHENYLEPHRINE HCL 10 MG/ML IJ SOLN
INTRAMUSCULAR | Status: AC
  Filled 2016-12-08: qty 1

## 2016-12-08 MED ORDER — HYDROMORPHONE HCL 1 MG/ML IJ SOLN
INTRAMUSCULAR | Status: AC
  Filled 2016-12-08: qty 1

## 2016-12-08 MED ORDER — LACTATED RINGERS IV SOLN
INTRAVENOUS | Status: DC
  Administered 2016-12-08 (×3): via INTRAVENOUS

## 2016-12-08 SURGICAL SUPPLY — 50 items
ADH SKN CLS APL DERMABOND .7 (GAUZE/BANDAGES/DRESSINGS) ×1
BAG URO DRAIN 2000ML W/SPOUT (MISCELLANEOUS) ×3 IMPLANT
BLADE SURG SZ11 CARB STEEL (BLADE) ×3 IMPLANT
CATH FOLEY 2WAY  5CC 16FR (CATHETERS) ×2
CATH FOLEY 2WAY 5CC 16FR (CATHETERS) ×1
CATH URTH 16FR FL 2W BLN LF (CATHETERS) ×1 IMPLANT
CHLORAPREP W/TINT 26ML (MISCELLANEOUS) ×3 IMPLANT
CORD MONOPOLAR M/FML 12FT (MISCELLANEOUS) ×3 IMPLANT
DERMABOND ADVANCED (GAUZE/BANDAGES/DRESSINGS) ×2
DERMABOND ADVANCED .7 DNX12 (GAUZE/BANDAGES/DRESSINGS) ×1 IMPLANT
DRSG TEGADERM 2X2.25 PEDS (GAUZE/BANDAGES/DRESSINGS) ×9 IMPLANT
ELECT REM PT RETURN 9FT ADLT (ELECTROSURGICAL) ×3
ELECTRODE REM PT RTRN 9FT ADLT (ELECTROSURGICAL) ×1 IMPLANT
GAUZE PETRO XEROFOAM 1X8 (MISCELLANEOUS) ×1 IMPLANT
GLOVE BIO SURGEON STRL SZ 6.5 (GLOVE) ×4 IMPLANT
GLOVE BIO SURGEON STRL SZ8 (GLOVE) ×25 IMPLANT
GLOVE BIO SURGEONS STRL SZ 6.5 (GLOVE) ×2
GLOVE INDICATOR 7.0 STRL GRN (GLOVE) ×3 IMPLANT
GOWN STRL REUS W/ TWL LRG LVL3 (GOWN DISPOSABLE) ×2 IMPLANT
GOWN STRL REUS W/ TWL XL LVL3 (GOWN DISPOSABLE) ×1 IMPLANT
GOWN STRL REUS W/TWL LRG LVL3 (GOWN DISPOSABLE) ×6
GOWN STRL REUS W/TWL XL LVL3 (GOWN DISPOSABLE) ×3
IRRIGATION STRYKERFLOW (MISCELLANEOUS) ×1 IMPLANT
IRRIGATOR STRYKERFLOW (MISCELLANEOUS)
IV LACTATED RINGERS 1000ML (IV SOLUTION) ×3 IMPLANT
KIT PINK PAD W/HEAD ARE REST (MISCELLANEOUS) ×3
KIT PINK PAD W/HEAD ARM REST (MISCELLANEOUS) ×1 IMPLANT
KIT RM TURNOVER CYSTO AR (KITS) ×3 IMPLANT
LABEL OR SOLS (LABEL) ×3 IMPLANT
PACK BASIN MINOR ARMC (MISCELLANEOUS) ×3 IMPLANT
PACK GYN LAPAROSCOPIC (MISCELLANEOUS) ×3 IMPLANT
PAD OB MATERNITY 4.3X12.25 (PERSONAL CARE ITEMS) ×3 IMPLANT
PENCIL ELECTRO HAND CTR (MISCELLANEOUS) ×3 IMPLANT
SCISSORS METZENBAUM CVD 33 (INSTRUMENTS) IMPLANT
SHEARS HARMONIC ACE PLUS 36CM (ENDOMECHANICALS) ×3 IMPLANT
SLEEVE ENDOPATH XCEL 5M (ENDOMECHANICALS) ×6 IMPLANT
SUT CHROMIC 2 0 CT 1 (SUTURE) ×6 IMPLANT
SUT MNCRL 4-0 (SUTURE) ×3
SUT MNCRL 4-0 27XMFL (SUTURE) ×1
SUT VIC AB 0 CT1 27 (SUTURE) ×3
SUT VIC AB 0 CT1 27XCR 8 STRN (SUTURE) IMPLANT
SUT VIC AB 0 CT1 36 (SUTURE) ×5 IMPLANT
SUT VIC AB 2-0 SH 27 (SUTURE) ×3
SUT VIC AB 2-0 SH 27XBRD (SUTURE) IMPLANT
SUTURE MNCRL 4-0 27XMF (SUTURE) ×1 IMPLANT
SYR 30ML LL (SYRINGE) ×2 IMPLANT
SYRINGE 10CC LL (SYRINGE) ×3 IMPLANT
TAPE TRANSPORE STRL 2 31045 (GAUZE/BANDAGES/DRESSINGS) ×3 IMPLANT
TROCAR XCEL NON-BLD 5MMX100MML (ENDOMECHANICALS) ×3 IMPLANT
TUBING INSUF HEATED (TUBING) ×3 IMPLANT

## 2016-12-08 NOTE — Anesthesia Procedure Notes (Signed)
Procedure Name: Intubation Date/Time: 12/08/2016 7:58 AM Performed by: Leander Rams Pre-anesthesia Checklist: Patient identified, Emergency Drugs available, Suction available, Patient being monitored and Timeout performed Patient Re-evaluated:Patient Re-evaluated prior to inductionOxygen Delivery Method: Circle system utilized Preoxygenation: Pre-oxygenation with 100% oxygen Intubation Type: IV induction Laryngoscope Size: Mac and 3 Grade View: Grade I Tube type: Oral Tube size: 6.5 mm Number of attempts: 1 Airway Equipment and Method: Stylet Placement Confirmation: ETT inserted through vocal cords under direct vision,  positive ETCO2,  CO2 detector and breath sounds checked- equal and bilateral Tube secured with: Tape Dental Injury: Teeth and Oropharynx as per pre-operative assessment

## 2016-12-08 NOTE — Anesthesia Post-op Follow-up Note (Cosign Needed)
Anesthesia QCDR form completed.        

## 2016-12-08 NOTE — Op Note (Signed)
OPERATIVE NOTE:  Beverly DiversChristina M Haley PROCEDURE DATE: 12/08/2016   PREOPERATIVE DIAGNOSIS:  1. Symptomatic endometriosis  POSTOPERATIVE DIAGNOSIS:  1. Symptomatic endometriosis  PROCEDURE: LAVH BSO  SURGEON:  Herold HarmsMartin A Cyenna Rebello, MD ASSISTANTS: Dr. Brennan Baileyavid Evans and PA-S Ermalinda MemosKristen Harper ANESTHESIA: General INDICATIONS: 46 y.o. Who presents for definitive surgery for symptomatic endometriosis. Previous laparoscopy did demonstrate endometriosis. Because of chronic ongoing pain, patient desires definitive surgery  FINDINGS: grossly normal-appearing uterus; left tube and ovary appeared normal; right ovary had hemorrhagic cyst versus small endometrioma; peritoneum is notable for some scarring in the cul-de-sac and yellow lesions in the left ovarian fossa consistent with endometriosis as well as white lesions in the cul-de-sac consistent with endometriosis I/O's: Total I/O In: 1000 [I.V.:1000] Out: 200 [Blood:200] COUNTS:  YES SPECIMEN: uterus with cervix and bilateral fallopian tubes and ovaries  ANTIBIOTIC PROPHYLAXIS:Ancef 2 grams COMPLICATIONS: None immediate  PROCEDURE IN DETAIL:patient was brought to the operating room placed in supine position. General endotracheal anesthesia was induced without difficulty. She was placed in the dorsal lithotomy position using the bumblebee stirrups. A ChloraPrep and Hibiclens abdominal perineal intravaginal prep and drape was performed in standard fashion. Timeout was completed. Foley catheter was placed in the bladder and was draining clear yellow urine. Speculum was placed and the cervix was grasped with a single-tooth tenaculum; subsequently Hulka tenaculum was placed onto the cervix to facilitate uterine manipulation. Subumbilical vertical incision 5 mm in length was made. The Optiview laparoscopic trocar system was used to place a 5 mm port directly into the abdominal pelvic cavity without evidence of bowel or vascular injury. 2 other additional 5 mm  ports were placed in the right and left lower quadrants respectively. The laparoscopic part of the hysterectomy was then performed in standard fashion. The Ace Harmonic scalpel was used along with graspers to facilitate this procedure. The ureters were identified on the lateral pelvic sidewalls and avoided during the case. The infundibulopelvic ligaments were clamped coagulated and cut. The mesosalpinx likewise was clamped coagulated and cut. The round ligament was clamped coagulated and cut. The anterior posterior leafs of the broad ligament were opened and the bladder flap was created through blunt and sharp dissection using the Ace Harmonic scalpel. The uterine arteries were skeletonized and then coagulated using the Kleppinger bipolar forceps. Eventually these vessels were transected. Once the abdominal laparoscopic part of the hysterectomy was performed, the vaginal approach was performed. A posterior colpotomy was made with Mayo scissors. Uterosacral ligaments were clamped cut and stick tied using 0 Vicryl suture. The remaining cardinal broad ligament complexes were clamped cut and stick tied using 0 Vicryl suture. The specimen was then removed from the operative field. One small bleeder on the left pelvic sidewall had to be ligated using a figure-of-eight suture of 0 Vicryl. The posterior cuff was run using a 0 Vicryl baseball stitch. The vagina was closed using simple interrupted sutures of 2-0 chromic. Following completion of the vaginal aspect of the procedure, laparoscopy was performed to assess all vascular pedicles. The pneumoperitoneum was recreated. The left vascular pedicles were noted to have slight oozing and this was controlled with Kleppinger bipolar forceps. The remainder of vascular pedicles were all hemostatic. The pelvis was irrigated and the irrigant fluid was aspirated. The abdomen was then closed with 4-0 Monocryl sutures in a simple interrupted manner. Dermabond glue was placed over the  incisions. Patient was then awakened mobilized and taken to recovery in satisfactory condition. Kalani Sthilaire A. Beatris Sie Francesco, MD, [facility]

## 2016-12-08 NOTE — Anesthesia Postprocedure Evaluation (Signed)
Anesthesia Post Note  Patient: Beverly Haley  Procedure(s) Performed: Procedure(s) (LRB): LAPAROSCOPIC ASSISTED VAGINAL HYSTERECTOMY WITH BILATERAL SALPINGECTOMY, RIGHT OOPHERECTOMY (N/A)  Patient location during evaluation: PACU Anesthesia Type: General Level of consciousness: awake Pain management: pain level controlled Vital Signs Assessment: post-procedure vital signs reviewed and stable Respiratory status: spontaneous breathing Cardiovascular status: stable Anesthetic complications: no     Last Vitals:  Vitals:   12/08/16 1033 12/08/16 1038  BP:  116/78  Pulse: 67 75  Resp: 12 (!) 22  Temp:      Last Pain:  Vitals:   12/08/16 1038  TempSrc:   PainSc: 10-Worst pain ever                 VAN STAVEREN,Elan Mcelvain

## 2016-12-08 NOTE — Anesthesia Preprocedure Evaluation (Signed)
Anesthesia Evaluation  Patient identified by MRN, date of birth, ID band Patient awake    Reviewed: Allergy & Precautions, NPO status , Patient's Chart, lab work & pertinent test results  Airway Mallampati: II       Dental  (+) Teeth Intact   Pulmonary neg pulmonary ROS,    breath sounds clear to auscultation       Cardiovascular Exercise Tolerance: Good  Rhythm:Regular     Neuro/Psych  Headaches,    GI/Hepatic negative GI ROS, Neg liver ROS,   Endo/Other  negative endocrine ROS  Renal/GU negative Renal ROS     Musculoskeletal negative musculoskeletal ROS (+)   Abdominal   Peds negative pediatric ROS (+)  Hematology negative hematology ROS (+)   Anesthesia Other Findings   Reproductive/Obstetrics                             Anesthesia Physical Anesthesia Plan  ASA: I  Anesthesia Plan: General   Post-op Pain Management:    Induction: Intravenous  Airway Management Planned: Oral ETT  Additional Equipment:   Intra-op Plan:   Post-operative Plan: Extubation in OR  Informed Consent: I have reviewed the patients History and Physical, chart, labs and discussed the procedure including the risks, benefits and alternatives for the proposed anesthesia with the patient or authorized representative who has indicated his/her understanding and acceptance.     Plan Discussed with: CRNA  Anesthesia Plan Comments:         Anesthesia Quick Evaluation

## 2016-12-08 NOTE — Interval H&P Note (Signed)
History and Physical Interval Note:  12/08/2016 7:27 AM  Beverly Haley  has presented today for surgery, with the diagnosis of ENLARGED UTERUS, PELVIC PAIN, H/O ENDOMETRIAL ABLATION, ENDOMETRIOSIS  The various methods of treatment have been discussed with the patient and family. After consideration of risks, benefits and other options for treatment, the patient has consented to  Procedure(s): Pt has decided to have BOTH tubes and ovaries removedL: LAPAROSCOPIC ASSISTED VAGINAL HYSTERECTOMY WITH BILATERAL SALPINGO OOPHORECTOMY as a surgical intervention .  The patient's history has been reviewed, patient examined, no change in status, stable for surgery.  I have reviewed the patient's chart and labs.  Questions were answered to the patient's satisfaction.     Daphine DeutscherMartin A Jovonda Selner

## 2016-12-08 NOTE — Transfer of Care (Signed)
Immediate Anesthesia Transfer of Care Note  Patient: Beverly DiversChristina M Haley  Procedure(s) Performed: Procedure(s): LAPAROSCOPIC ASSISTED VAGINAL HYSTERECTOMY WITH BILATERAL SALPINGECTOMY, RIGHT OOPHERECTOMY (N/A)  Patient Location: PACU  Anesthesia Type:General  Level of Consciousness: awake  Airway & Oxygen Therapy: Patient Spontanous Breathing  Post-op Assessment: Report given to RN  Post vital signs: stable  Last Vitals:  Vitals:   12/08/16 0638 12/08/16 1023  BP: 133/86 (!) 105/91  Pulse: 71 66  Resp: 16 12  Temp: 36.4 C 36.6 C    Last Pain:  Vitals:   12/08/16 0638  TempSrc: Oral  PainSc: 7       Patients Stated Pain Goal: 2 (12/08/16 [id number])  Complications: No apparent anesthesia complications

## 2016-12-08 NOTE — H&P (View-Only) (Signed)
Subjective:  PREOPERATIVE HISTORY AND PHYSICAL   Date of surgery: 12/08/2016 Diagnosis: 1. Symptomatic endometriosis 2. Status post endometrial ablation Procedure: LAVH bilateral salpingectomy with right oophorectomy   Patient is a 46 y.o. [id number] scheduled for LAVH bilateral salpingectomy with right oophorectomy on 12/08/2016. Patient has menorrhagia with regular cycle. She has long history of dysmenorrhea. On clinical exam uterus was bulky (slightly enlarged. Laparoscopy on 06/16/2016 demonstrated probable endometriosis. She underwent endometrial ablation at that time. She has continued to have pelvic pain since her surgery, and it has exacerbated recently she has not had any bleeding since her ablation..  Pertinent Gynecological History: History of dysmenorrhea Status post endometrial ablation History of endometriosis  OB History    Gravida Para Term Preterm AB Living   3 3 3     3    SAB TAB Ectopic Multiple Live Births           3     No LMP recorded. Patient has had an ablation.    Past Medical History:  Diagnosis Date  . Hip pain   . Hyperlipidemia   . Migraine   . 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 APPENDECTOMY    . BREAST SURGERY    . HYSTEROSCOPY WITH NOVASURE N/A 06/16/2016   Procedure: HYSTEROSCOPY WITH NOVASURE;  Surgeon: Herold HarmsMartin A Tamie Minteer, MD;  Location: [facility] ORS;  Service: Gynecology;  Laterality: N/A;  . LAPAROSCOPY N/A 06/16/2016   Procedure: LAPAROSCOPY DIAGNOSTIC WITH BIOPSIES;  Surgeon: Herold HarmsMartin A Jamarr Treinen, MD;  Location: [facility] ORS;  Service: Gynecology;  Laterality: N/A;  . PLACEMENT OF BREAST IMPLANTS      OB History  Gravida Para Term Preterm AB Living  3 3 3     3   SAB TAB Ectopic Multiple Live Births          3    # Outcome Date GA Lbr Len/2nd Weight Sex Delivery Anes PTL Lv  3 Term 2006   7 lb 9.6 oz (3.447 kg) F Vag-Spont   LIV   2 Term 1999   8 lb 14.4 oz (4.037 kg) M Vag-Spont   LIV  1 Term 1995   7 lb 3.2 oz (3.266 kg) F Vag-Spont   LIV      Social History   Social History  . Marital status: Married    Spouse name: N/A  . Number of children: N/A  . Years of education: N/A   Social History Main Topics  . Smoking status: Never Smoker  . Smokeless tobacco: Never Used  . Alcohol use 4.2 oz/week    7 Shots of liquor per week  . Drug use: No  . Sexual activity: Yes    Birth control/ protection: None     Comment: Spouse had vasectomy   Other Topics Concern  . None   Social History Narrative  . None    Family History  Problem Relation Age of Onset  . Migraines Mother   . Hyperlipidemia Father   . Hypertension Father   . Breast cancer Neg Hx   . Ovarian cancer Neg Hx   . Colon cancer Neg Hx   . Diabetes Neg Hx      (Not in a hospital admission)  Allergies  Allergen Reactions  . Peanut-Containing Drug Products Other (See Comments)    "severe headache"    Review of Systems Constitutional: No recent fever/chills/sweats  Respiratory: No recent cough/bronchitis Cardiovascular: No chest pain Gastrointestinal: No recent nausea/vomiting/diarrhea Genitourinary: No UTI symptoms Hematologic/lymphatic:No history of coagulopathy or recent blood thinner use    Objective:    BP 123/78   Pulse 71   Ht  (1.676 m)   Wt 138 lb 8 oz (62.8 kg)   BMI 22.35 kg/m   General:   Normal  Skin:   normal  HEENT:  Normal  Neck:  Supple without Adenopathy or Thyromegaly  Lungs:   Heart:              Breasts:   Abdomen:  Pelvis:  M/S   Extremeties:  Neuro:    clear to auscultation bilaterally   Normal without murmur   Not Examined   soft, non-tender; bowel sounds normal; no masses,  no organomegaly   Exam deferred to OR  No CVAT  Warm/Dry   Normal        11/28/2016 PELVIC:             External Genitalia: Normal             BUS: Normal             Vagina: Normal             Cervix:  Cervical motion tenderness 2/4             Uterus: Uterine tenderness 2/4; midplane, top normal size             Adnexa: No masses; nontender             RV: External exam normal             Bladder: Nontender  Assessment:    1. Bulky or enlarged uterus  2. Pelvic pain Chronic with history of endometriosis. Constant after ablation with acute worsening on 4/17.   3. History of endometrial ablation  4. Endometriosis Constant lower abdominal pain with worsening right lower quadrant pain since 4/17.         Plan:  LAVH bilateral salpingectomy and right oophorectomy   Preop counseling: Patient is to undergo LAVH bilateral salpingectomy with right oophorectomy on 12/08/2016. She is understanding of the planned procedure. She is aware of and is accepting of all surgical risks which include but are not limited to bleeding, infection, pelvic organ injury with need for repair, blood clot disorders, anesthesia risks, etc. All questions have been answered. Informed consent is given. Patient is ready and willing to proceed with surgery as scheduled.  Herold Harms, MD  Note: This dictation was prepared with Dragon dictation along with smaller phrase technology. Any transcriptional errors that result from this process are unintentional.

## 2016-12-09 DIAGNOSIS — N72 Inflammatory disease of cervix uteri: Secondary | ICD-10-CM | POA: Diagnosis not present

## 2016-12-09 LAB — SURGICAL PATHOLOGY

## 2016-12-09 LAB — HEMOGLOBIN: Hemoglobin: 10.6 g/dL — ABNORMAL LOW (ref 12.0–16.0)

## 2016-12-09 MED ORDER — IBUPROFEN 800 MG PO TABS: 800.0000 mg | ORAL_TABLET | Freq: Three times a day (TID) | ORAL | 1 refills | Status: DC

## 2016-12-09 MED ORDER — DOCUSATE SODIUM 100 MG PO CAPS: 100.0000 mg | ORAL_CAPSULE | Freq: Two times a day (BID) | ORAL | 2 refills | Status: DC | PRN

## 2016-12-09 MED ORDER — ESTRADIOL 1 MG PO TABS: 1.0000 mg | ORAL_TABLET | Freq: Every day | ORAL | 12 refills | Status: DC

## 2016-12-09 MED ORDER — OXYCODONE-ACETAMINOPHEN 5-325 MG PO TABS: 1.0000 | ORAL_TABLET | ORAL | 0 refills | Status: DC | PRN

## 2016-12-09 NOTE — Progress Notes (Signed)
Discharge pt to home to car via auxillary

## 2016-12-09 NOTE — Progress Notes (Signed)
Discharge instr reviewed with pt.  Rx given for home use.

## 2016-12-09 NOTE — Progress Notes (Signed)
Dr Mable Parisefransceo here to see pt and discharge her.  D/C teaching completed

## 2016-12-12 NOTE — Discharge Summary (Signed)
Physician Discharge Summary  [name] MRN: [id number] DOB/AGE: 46/02/1971 45 y.o.  Admit date: 12/08/2016 Discharge date: 12/12/2016  Admission Diagnoses: Endometriosis  Discharge Diagnoses:  Endometriosis  [facility]ative Procedures: [facility].   Significant Diagnostic Studies:  Lab Results  Component Value Date   HGB 10.6 (L) 12/09/2016   HGB 12.6 12/04/2016   HGB 12.4 06/12/2016   Lab Results  Component Value Date   HCT 36.3 12/04/2016   HCT 38.9 11/20/2016   HCT 40.7 11/18/2016   CBC Latest Ref Rng & Units 12/09/2016 12/04/2016 11/20/2016  WBC 3.6 - 11.0 K/uL - 7.2 5.5  Hemoglobin 12.0 - 16.0 g/dL 10.6(L) 12.6 -  Hematocrit 35.0 - 47.0 % - 36.3 38.9  Platelets 150 - 440 K/uL - 244 216     Discharged Condition: good  Discharge Exam: Blood pressure (!) 101/59, pulse 70, temperature 98.5 F (36.9 C), temperature source Oral, resp. rate 18, height 5\' 6"  (1.676 m), weight 138 lb (62.6 kg), SpO2 97 %. Incision/Wound: clean, dry and no drainage  Disposition: 01-Home or Self Care  Discharge Instructions    Discharge patient    Complete by:  As directed    After lunch   Discharge disposition:  01-Home or Self Care   Discharge patient date:  12/09/2016     Allergies as of 12/09/2016      Reactions   Peanut-containing Drug Products Other (See Comments)   "severe headache"      Medication List    TAKE these medications   acetaminophen 500 MG tablet Commonly known as:  TYLENOL Take 1,000 mg by mouth every 8 (eight) hours as needed for moderate pain or headache.   BC HEADACHE PO Take 1-2 packets by mouth daily as needed (headaches).   CAYENNE PO Take 1 tablet by mouth daily.   docusate sodium 100 MG capsule Commonly known as:  COLACE Take 1 capsule (100 mg total) by mouth 2 (two) times daily as needed.   estradiol 1 MG tablet Commonly known as:  ESTRACE Take 1 tablet (1 mg total) by mouth daily.    ibuprofen 800 MG tablet Commonly known as:  ADVIL,MOTRIN Take 1 tablet (800 mg total) by mouth 3 (three) times daily.   multivitamin with minerals Tabs tablet Take 7 tablets by mouth daily. 1 PACKET OF VITAMINS THAT CONTAIN 7 DIFFERENT VITAMINS   oxyCODONE-acetaminophen 5-325 MG tablet Commonly known as:  ROXICET Take 1-2 tablets by mouth every 4 (four) hours as needed for moderate pain or severe pain.      Follow-up Information    Candia Kingsbury, Prentice DockerMartin A, MD In 1 week.   Specialties:  Obstetrics and Gynecology, Radiology Why:  Post Op Check Contact information: [address] [place] [address] [phone]           Signed: Prentice DockerMartin A Natausha Jungwirth 12/12/2016, 10:58 AM

## 2016-12-18 ENCOUNTER — Ambulatory Visit (INDEPENDENT_AMBULATORY_CARE_PROVIDER_SITE_OTHER): Payer: Medicaid Other | Admitting: Obstetrics and Gynecology

## 2016-12-18 ENCOUNTER — Encounter: Payer: Self-pay | Admitting: Obstetrics and Gynecology

## 2016-12-18 VITALS — BP 96/60 | HR 79 | Ht 66.0 in | Wt 135.8 lb

## 2016-12-18 DIAGNOSIS — N809 Endometriosis, unspecified: Secondary | ICD-10-CM

## 2016-12-18 DIAGNOSIS — Z09 Encounter for follow-up examination after completed treatment for conditions other than malignant neoplasm: Secondary | ICD-10-CM

## 2016-12-18 DIAGNOSIS — Z90710 Acquired absence of both cervix and uterus: Secondary | ICD-10-CM

## 2016-12-18 NOTE — Progress Notes (Signed)
Chief complaint: 1. Postop check-10 days 2. Status post LAVH BSO for symptomatic endometriosis  Patient presents for initial postop check. She is doing well with normal bowel and bladder function. Currently she is only taking ibuprofen 800 mg periodically for pain relief. 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 She is taking estradiol 1 mg a day and has not experienced any vasomotor symptoms.  Pathology from surgery: DIAGNOSIS:  A. UTERUS WITH CERVIX, BILATERAL FALLOPIAN TUBES AND OVARIES; TOTAL  HYSTERECTOMY WITH BILATERAL SALPINGECTOMY:  - CHRONIC CERVICITIS.  - ENDOMETRIAL SURFACE WITH CHANGES CONSISTENT WITH PRIOR ABLATION.  - UNREMARKABLE MYOMETRIUM  - RIGHT OVARY WITH HEMORRHAGIC CORPUS LUTEAL CYST.  - LEFT FALLOPIAN TUBE WITH BENIGN PARATUBAL CYST.  - UNREMARKABLE RIGHT FALLOPIAN TUBE AND LEFT OVARY  - NEGATIVE FOR ATYPIA AND MALIGNANCY.     OBJECTIVE: BP 96/60   Pulse 79   Ht 5\' 6"  (1.676 m)   Wt 135 lb 12.8 oz (61.6 kg)   LMP 08/04/2016   BMI 21.92 kg/m  Pleasant female in no acute distress. Alert and oriented. Abdomen: Soft, nontender; laparoscopy incision sites are well approximated and still covered with Dermabond glue. No evidence of infection or hernia  Pelvic exam: Deferred  ASSESSMENT: 1. Normal postop check-10 days status post LAVH BSO 2. Tolerating estradiol 1 mg day for management of potential vasomotor symptoms  PLAN: 1. Continue with increasing activity as tolerated 2. Continue estradiol 1 mg a day 3. Return in 4 weeks for final postop check  Herold HarmsMartin A Defrancesco, MD  Note: This dictation was prepared with Dragon dictation along with smaller phrase technology. Any transcriptional errors that result from this process are unintentional.

## 2016-12-18 NOTE — Patient Instructions (Signed)
1. Return in 4 weeks for final postop check 2. Gradually increase activities as tolerated 3. Continue taking estradiol 1 mg a day

## 2016-12-23 ENCOUNTER — Encounter: Payer: Self-pay | Admitting: Obstetrics and Gynecology

## 2016-12-24 ENCOUNTER — Ambulatory Visit (INDEPENDENT_AMBULATORY_CARE_PROVIDER_SITE_OTHER): Payer: Medicaid Other | Admitting: Obstetrics and Gynecology

## 2016-12-24 ENCOUNTER — Encounter: Payer: Self-pay | Admitting: Obstetrics and Gynecology

## 2016-12-24 VITALS — BP 116/68 | HR 76 | Ht 66.0 in | Wt 137.6 lb

## 2016-12-24 DIAGNOSIS — N939 Abnormal uterine and vaginal bleeding, unspecified: Secondary | ICD-10-CM

## 2016-12-24 DIAGNOSIS — Z90710 Acquired absence of both cervix and uterus: Secondary | ICD-10-CM

## 2016-12-24 DIAGNOSIS — Z09 Encounter for follow-up examination after completed treatment for conditions other than malignant neoplasm: Secondary | ICD-10-CM

## 2016-12-24 NOTE — Progress Notes (Signed)
Chief complaint: 1. Vaginal bleeding 2. Status post LAVH 2 weeks ago  Patient had heavy vaginal bleeding yesterday associated with acute right lower quadrant discomfort lasting seconds. Bowel bladder function are normal. Continues to soil pads with bright red blood.  Past medical history, past surgical history, problem list, medications, and allergies are reviewed  OBJECTIVE: BP 116/68   Pulse 76   Ht 5\' 6"  (1.676 m)   Wt 137 lb 9.6 oz (62.4 kg)   LMP 08/04/2016   BMI 22.21 kg/m  Pleasant female in no acute distress.  Abdomen: Soft, nontender; laparoscopy incision sites are well approximated and healing without evidence of hernia Pelvic: Normal appearing perineum without blood staining External genitalia-normal BUS-normal Vagina-minimal pink discharge in vaginal vault; good vault support is noted; sutures and vaginal cuff are intact except for 1 small suture which is removed with Fox swab. Bimanual-will masses or tenderness; vaginal cuff intact  ASSESSMENT: 1. Postoperative vaginal bleeding, unclear etiology, nonacute 2. Normal postop course to date 3. Patient working more than recommended  PLAN: 1. Limit lifting to no more than 5 pounds 2. Limited work to no more than 4 hours a day 3. Return for final postop check as scheduled or sooner if acute bright red bleeding worsen. Develops, or if acute pelvic pain develops.  Herold HarmsMartin A Delayna Sparlin, MD  Note: This dictation was prepared with Dragon dictation along with smaller phrase technology. Any transcriptional errors that result from this process are unintentional.

## 2016-12-24 NOTE — Patient Instructions (Signed)
1. No heavy lifting more than 5 pounds 2. Do not work more than 4 hours a day 3. Return for any heavy bleeding worsen., Fever, nausea or vomiting, severe pelvic pain

## 2017-01-07 ENCOUNTER — Encounter: Payer: Self-pay | Admitting: Obstetrics and Gynecology

## 2017-01-07 ENCOUNTER — Ambulatory Visit (INDEPENDENT_AMBULATORY_CARE_PROVIDER_SITE_OTHER): Payer: Medicaid Other | Admitting: Obstetrics and Gynecology

## 2017-01-07 VITALS — BP 108/69 | HR 75 | Ht 66.0 in | Wt 132.6 lb

## 2017-01-07 DIAGNOSIS — Z90710 Acquired absence of both cervix and uterus: Secondary | ICD-10-CM

## 2017-01-07 DIAGNOSIS — Z09 Encounter for follow-up examination after completed treatment for conditions other than malignant neoplasm: Secondary | ICD-10-CM

## 2017-01-07 NOTE — Patient Instructions (Addendum)
1. Return in 2 weeks for final postop check 2. Resume activities as tolerated with the exception of intercourse for another 2 weeks 3. Continue taking estradiol 1 mg daily

## 2017-01-07 NOTE — Progress Notes (Signed)
Chief complaint: 1. Postop check-4 weeks 2. Status post LAVH  Patient is doing well 4 weeks post LAVH BSO. Bowel and bladder function are normal. She is not experiencing any pelvic pain. She has not experienced any vaginal discharge or bleeding. She is working approximately 4 hours a day. She is not taking any analgesic medications. Estrogen replacement therapy is estradiol 1 mg a day; she is without vasomotor symptoms.  OBJECTIVE: BP 108/69   Pulse 75   Ht 5\' 6"  (1.676 m)   Wt 132 lb 9.6 oz (60.1 kg)   LMP 08/04/2016   BMI 21.40 kg/m  Pleasant well-appearing female in no acute stress Abdomen: Soft, nontender; laparoscopy incision sites are well approximated and healed Pelvic exam: External genitalia normal BUS-normal Vagina-good vault support; vaginal cuff is intact, 2 Vicryl sutures are still identified at the top of the cuff and there is minimal friability noted with Q-tip palpation.  Bimanual exam reveals no significant cuff tenderness or induration; no palpable adnexal masses  ASSESSMENT: 1. Status post LAVH BSO 4 weeks ago 2. Normal postop check; residual suture at the vaginal cuff 3. ERT adequate  PLAN: 1. Resume activities as tolerated with the exception of intercourse for another 2 weeks 2. Return in 2 weeks for final postop check 3. Continue with estradiol 1 mg daily  Herold HarmsMartin A Defrancesco, MD  Note: This dictation was prepared with Dragon dictation along with smaller phrase technology. Any transcriptional errors that result from this process are unintentional.

## 2017-01-08 ENCOUNTER — Encounter: Payer: Medicaid Other | Admitting: Obstetrics and Gynecology

## 2017-01-21 ENCOUNTER — Encounter: Payer: Self-pay | Admitting: Family Medicine

## 2017-01-21 ENCOUNTER — Ambulatory Visit (INDEPENDENT_AMBULATORY_CARE_PROVIDER_SITE_OTHER): Payer: Medicaid Other | Admitting: Family Medicine

## 2017-01-21 VITALS — BP 127/82 | HR 63 | Temp 98.4°F | Wt 134.0 lb

## 2017-01-21 DIAGNOSIS — G8929 Other chronic pain: Secondary | ICD-10-CM

## 2017-01-21 DIAGNOSIS — M25562 Pain in left knee: Secondary | ICD-10-CM | POA: Diagnosis not present

## 2017-01-21 MED ORDER — TRIAMCINOLONE ACETONIDE 40 MG/ML IJ SUSP
40.0000 mg | Freq: Once | INTRAMUSCULAR | Status: AC
  Administered 2017-01-21: 40 mg via INTRAMUSCULAR
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
## 2017-01-22 ENCOUNTER — Ambulatory Visit
Admission: RE | Admit: 2017-01-22 | Discharge: 2017-01-22 | Disposition: A | Discharge status: Home / Self Care | Payer: Medicaid Other | Source: Ambulatory Visit | Attending: Family Medicine | Admitting: Family Medicine

## 2017-01-22 ENCOUNTER — Ambulatory Visit (INDEPENDENT_AMBULATORY_CARE_PROVIDER_SITE_OTHER): Payer: Medicaid Other | Admitting: Obstetrics and Gynecology

## 2017-01-22 ENCOUNTER — Encounter: Payer: Self-pay | Admitting: Obstetrics and Gynecology

## 2017-01-22 VITALS — BP 108/65 | HR 70 | Ht 66.0 in | Wt 133.6 lb

## 2017-01-22 DIAGNOSIS — M25562 Pain in left knee: Secondary | ICD-10-CM | POA: Diagnosis not present

## 2017-01-22 DIAGNOSIS — Z09 Encounter for follow-up examination after completed treatment for conditions other than malignant neoplasm: Secondary | ICD-10-CM | POA: Diagnosis not present

## 2017-01-22 DIAGNOSIS — G8929 Other chronic pain: Secondary | ICD-10-CM

## 2017-01-22 DIAGNOSIS — Z90710 Acquired absence of both cervix and uterus: Secondary | ICD-10-CM

## 2017-01-22 DIAGNOSIS — N898 Other specified noninflammatory disorders of vagina: Secondary | ICD-10-CM

## 2017-01-22 NOTE — Progress Notes (Signed)
resident maybe i had refresh having of the GYN ENCOUNTER NOTE  Subjective:       Beverly Haley is a 46 y.o. [phone] female is here for gynecologic evaluation of the following issues:   1. 6 week s/p LAVH BSO for symptomatic endometriosis   Patient is here for her 6 week follow up.  She states she is doing well and having no pain, bleeding or discharge.  Appetite is normal, no N/V/D.  Bladder and Bowel function are normal. She is not having to take any OTC pain medication and has resumed all ADLs with no pain or discomfort.  She is compliant with taking estradiol 1mg  daily and has not had any vasomotor symptoms.  Gynecologic History Patient's last menstrual period was 08/04/2016. Contraception: status post hysterectomy   Obstetric History OB History  Gravida Para Term Preterm AB Living  3 3 3     3   SAB TAB Ectopic Multiple Live Births          3    # Outcome Date GA Lbr Len/2nd Weight Sex Delivery Anes PTL Lv  3 Term 2006   7 lb 9.6 oz (3.447 kg) F Vag-Spont   LIV  2 Term 1999   8 lb 14.4 oz (4.037 kg) M Vag-Spont   LIV  1 Term 1995   7 lb 3.2 oz (3.266 kg) F Vag-Spont   LIV      Past Medical History:  Diagnosis Date  . Hip pain   . Hyperlipidemia   . Migraine   . 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 APPENDECTOMY    . BREAST SURGERY    . HYSTEROSCOPY WITH NOVASURE N/A 06/16/2016   Procedure: HYSTEROSCOPY WITH NOVASURE;  Surgeon: Herold Harms, MD;  Location: [facility];  Service: Gynecology;  Laterality: N/A;  . LAPAROSCOPIC VAGINAL HYSTERECTOMY WITH SALPINGECTOMY N/A 12/08/2016   Procedure: LAPAROSCOPIC ASSISTED VAGINAL HYSTERECTOMY WITH BILATERAL SALPINGECTOMY, RIGHT OOPHERECTOMY;  Surgeon: Herold Harms, MD;  Location: [facility];  Service: Gynecology;  Laterality: N/A;  . LAPAROSCOPY N/A 06/16/2016   Procedure: LAPAROSCOPY DIAGNOSTIC WITH BIOPSIES;   Surgeon: Herold Harms, MD;  Location: [facility] OR[facility];  Service: Gynecology;  Laterality: N/A;  . PLACEMENT OF BREAST IMPLANTS      Current Outpatient Prescriptions on File Prior to Visit  Medication Sig Dispense Refill  . aspirin EC 81 MG tablet Take 81 mg by mouth daily.    . Biotin 10 MG CAPS Take by mouth.    . Capsicum, Cayenne, (CAYENNE PO) Take by mouth.    . Multiple Vitamin (MULTIVITAMIN WITH MINERALS) TABS tablet Take 7 tablets by mouth daily. 1 PACKET OF VITAMINS THAT CONTAIN 7 DIFFERENT VITAMINS    . estradiol (ESTRACE) 1 MG tablet Take 1 tablet (1 mg total) by mouth daily. 30 tablet 12   No current facility-administered medications on file prior to visit.     Allergies  Allergen Reactions  . Peanut-Containing Drug Products Other (See Comments)    "severe headache"    Social History   Social History  . Marital status: Married    Spouse name: N/A  . Number of children: N/A  . Years of education: N/A   Occupational History  . Not on file.   Social History Main Topics  . Smoking status: Never Smoker  . Smokeless tobacco: Never Used  . Alcohol use  4.2 oz/week    7 Shots of liquor per week  . Drug use: No  . Sexual activity: Yes    Birth control/ protection: None, Surgical     Comment: Spouse had vasectomy   Other Topics Concern  . Not on file   Social History Narrative  . No narrative on file    Family History  Problem Relation Age of Onset  . Migraines Mother   . Hyperlipidemia Father   . Hypertension Father   . Breast cancer Neg Hx   . Ovarian cancer Neg Hx   . Colon cancer Neg Hx   . Diabetes Neg Hx     The following portions of the patient's history were reviewed and updated as appropriate: allergies, current medications, past family history, past medical history, past social history, past surgical history and problem list.  Review of Systems Review of Systems  All other systems reviewed and are negative. See HPI for pertinent  ROS   Objective:   BP 108/65   Pulse 70   Ht 5\' 6"  (1.676 m)   Wt 133 lb 9.6 oz (60.6 kg)   LMP 08/04/2016   BMI 21.56 kg/m  CONSTITUTIONAL: Well-developed, well-nourished female in no acute distress.  HENT:  Normocephalic, atraumatic.  NECK: Not examined SKIN: Skin is warm and dry. Rash noted Left lower leg erythematous weeping. Not diaphoretic. No erythema. No pallor. NEUROLGIC: Alert and oriented to person, place, and time.  PSYCHIATRIC: Normal mood and affect. Normal behavior. Normal judgment and thought content. CARDIOVASCULAR:Not Examined RESPIRATORY: Not Examined BREASTS: Not Examined ABDOMEN: Soft, non distended; Non tender.  No Organomegaly. PELVIC:  External Genitalia: Normal  BUS: Normal  Vagina: Vaginal cuff intact, vaginal walls well supported no bleeding; granulation tissue noted at cuff and Monsel's medication applied.   Cervix: Absent  Uterus: Absent  Adnexa: Absent  RV: Not examined  Bladder: Not examined MUSCULOSKELETAL: Normal range of motion. No tenderness.  No cyanosis, clubbing, or edema.   PROCEDURE: Monsel solution applied to vaginal cuff granulation tissue with Q-tips  Assessment:   1. Status Post LAVH BSO 6 weeks ago 2. Normal postop check, mild granulation tissue at vaginal cuff 3. ERT working well For surgical MENOPAUSE   Plan:   1. Continue abstinence for 72 hrs.  2. Resume sexual activities as tolerated 3. Follow up 2 weeks for vaginal cuff and bimanual examination. 4. Continue with estradiol 1 mg a day  Herold HarmsMartin A Riley Hallum, MD  Note: This dictation was prepared with Dragon dictation along with smaller phrase technology. Any transcriptional errors that result from this process are unintentional.

## 2017-01-22 NOTE — Patient Instructions (Signed)
1. Medication applied to granulation tissue in the vagina. 2. Abstain from intercourse for 72 hours 3. Resume all activities after 72 hours 4. Return in 2 weeks for follow-up

## 2017-01-26 ENCOUNTER — Telehealth: Payer: Self-pay | Admitting: Family Medicine

## 2017-01-26 NOTE — Telephone Encounter (Signed)
Patient notified of results. She states she has an appt with Ortho tomorrow.

## 2017-01-26 NOTE — Telephone Encounter (Signed)
It looks like Fleet Contrasachel sent her a Clinical cytogeneticistmychart message about this:   Your knee x-ray came back normal with the recommendation of further imaging to look at soft tissue structures as discussed at your appt. We will keep the orthopedic referral and see what their recommendation is from there.  OK to relay this message.

## 2017-01-26 NOTE — Telephone Encounter (Signed)
Patient called to see if someone would call her with the results from her xray last week.   Thank You  [phone]

## 2017-01-27 ENCOUNTER — Encounter (INDEPENDENT_AMBULATORY_CARE_PROVIDER_SITE_OTHER): Payer: Self-pay | Admitting: Orthopedic Surgery

## 2017-01-27 ENCOUNTER — Ambulatory Visit (INDEPENDENT_AMBULATORY_CARE_PROVIDER_SITE_OTHER): Payer: Medicaid Other | Admitting: Orthopedic Surgery

## 2017-01-27 DIAGNOSIS — M25562 Pain in left knee: Secondary | ICD-10-CM | POA: Diagnosis not present

## 2017-01-27 DIAGNOSIS — G8929 Other chronic pain: Secondary | ICD-10-CM

## 2017-01-27 NOTE — Addendum Note (Signed)
Addended byPrescott Parma: Sonny Anthes on: 01/27/2017 09:40 AM   Modules accepted: Orders
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
## 2017-01-28 ENCOUNTER — Telehealth: Payer: Self-pay | Admitting: Family Medicine

## 2017-01-28 NOTE — Telephone Encounter (Signed)
Pt would like a call back from ClarkRachel. She wouldn't give me any information.
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
## 2017-02-06 ENCOUNTER — Ambulatory Visit
Admission: RE | Admit: 2017-02-06 | Discharge: 2017-02-06 | Disposition: A | Discharge status: Home / Self Care | Payer: Medicaid Other | Source: Ambulatory Visit | Attending: Orthopedic Surgery | Admitting: Orthopedic Surgery

## 2017-02-06 DIAGNOSIS — M25562 Pain in left knee: Secondary | ICD-10-CM | POA: Insufficient documentation

## 2017-02-06 DIAGNOSIS — G8929 Other chronic pain: Secondary | ICD-10-CM | POA: Diagnosis present

## 2017-02-06 DIAGNOSIS — M23322 Other meniscus derangements, posterior horn of medial meniscus, left knee: Secondary | ICD-10-CM | POA: Diagnosis not present

## 2017-02-06 DIAGNOSIS — S83412A Sprain of medial collateral ligament of left knee, initial encounter: Secondary | ICD-10-CM | POA: Diagnosis not present

## 2017-02-06 DIAGNOSIS — X58XXXA Exposure to other specified factors, initial encounter: Secondary | ICD-10-CM | POA: Diagnosis not present

## 2017-02-06 DIAGNOSIS — M7122 Synovial cyst of popliteal space [Baker], left knee: Secondary | ICD-10-CM | POA: Diagnosis not present

## 2017-02-12 ENCOUNTER — Encounter: Payer: Medicaid Other | Admitting: Obstetrics and Gynecology

## 2017-02-13 ENCOUNTER — Encounter (INDEPENDENT_AMBULATORY_CARE_PROVIDER_SITE_OTHER): Payer: Self-pay | Admitting: Orthopedic Surgery

## 2017-02-13 ENCOUNTER — Ambulatory Visit (INDEPENDENT_AMBULATORY_CARE_PROVIDER_SITE_OTHER): Payer: Medicaid Other | Admitting: Orthopedic Surgery

## 2017-02-13 DIAGNOSIS — M25562 Pain in left knee: Secondary | ICD-10-CM
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
## 2017-04-01 ENCOUNTER — Encounter: Payer: Self-pay | Admitting: Obstetrics and Gynecology

## 2017-04-09 ENCOUNTER — Ambulatory Visit: Payer: Self-pay | Admitting: Family Medicine

## 2017-04-14 ENCOUNTER — Ambulatory Visit: Payer: Medicaid Other | Admitting: Family Medicine

## 2017-06-05 ENCOUNTER — Ambulatory Visit: Payer: Self-pay | Admitting: *Deleted

## 2017-06-05 ENCOUNTER — Telehealth: Payer: Self-pay | Admitting: Family Medicine

## 2017-06-05 DIAGNOSIS — H5319 Other subjective visual disturbances: Secondary | ICD-10-CM

## 2017-06-05 NOTE — Telephone Encounter (Signed)
Patient stated she has been seeing bubble-like spots and lines especially when she stands up.  She is requesting referral for eye exam.

## 2017-06-05 NOTE — Telephone Encounter (Signed)
Copied from CRM [id number]. Topic: Quick Communication - See Telephone Encounter >> Jun 05, 2017 12:27 PM Beverly Haley, Lorielle Boehning, [place] wrote: CRM for notification. See Telephone encounter for:  06/05/17. Patient is seeing double vision like things are coming at her. This has been going on for two days now. Has been having headaches as well

## 2017-06-05 NOTE — Telephone Encounter (Signed)
Routing to provider  

## 2017-06-05 NOTE — Telephone Encounter (Signed)
Patient is requesting a referral for an opthalmology appointment with Dr. Clydene PughWoodard in [place].

## 2017-06-05 NOTE — Addendum Note (Signed)
Addended by: Dorcas CarrowJOHNSON, MEGAN P on: 06/05/2017 03:00 PM   Modules accepted: Orders

## 2017-07-06 ENCOUNTER — Ambulatory Visit: Payer: Self-pay | Admitting: *Deleted

## 2017-07-06 ENCOUNTER — Encounter: Payer: Self-pay | Admitting: Emergency Medicine

## 2017-07-06 ENCOUNTER — Other Ambulatory Visit: Payer: Self-pay

## 2017-07-06 ENCOUNTER — Emergency Department: Payer: Medicaid Other

## 2017-07-06 ENCOUNTER — Emergency Department
Admission: EM | Admit: 2017-07-06 | Discharge: 2017-07-06 | Disposition: A | Discharge status: Home / Self Care | Payer: Medicaid Other | Attending: Emergency Medicine | Admitting: Emergency Medicine

## 2017-07-06 DIAGNOSIS — E785 Hyperlipidemia, unspecified: Secondary | ICD-10-CM | POA: Insufficient documentation

## 2017-07-06 DIAGNOSIS — Z79899 Other long term (current) drug therapy: Secondary | ICD-10-CM | POA: Diagnosis not present

## 2017-07-06 DIAGNOSIS — Z7982 Long term (current) use of aspirin: Secondary | ICD-10-CM | POA: Insufficient documentation

## 2017-07-06 DIAGNOSIS — R079 Chest pain, unspecified: Secondary | ICD-10-CM | POA: Diagnosis not present

## 2017-07-06 LAB — FIBRIN DERIVATIVES D-DIMER (ARMC ONLY): Fibrin derivatives D-dimer (ARMC): 127.2 ng/mL (FEU) (ref 0.00–499.00)

## 2017-07-06 LAB — CBC
HCT: 36 % (ref 35.0–47.0)
HEMOGLOBIN: 12.5 g/dL (ref 12.0–16.0)
MCH: 32.1 pg (ref 26.0–34.0)
MCHC: 34.7 g/dL (ref 32.0–36.0)
MCV: 92.6 fL (ref 80.0–100.0)
Platelets: 213 10*3/uL (ref 150–440)
RBC: 3.88 MIL/uL (ref 3.80–5.20)
RDW: 12.4 % (ref 11.5–14.5)
WBC: 6.1 10*3/uL (ref 3.6–11.0)

## 2017-07-06 LAB — BASIC METABOLIC PANEL
ANION GAP: 8 (ref 5–15)
BUN: 15 mg/dL (ref 6–20)
CALCIUM: 9.3 mg/dL (ref 8.9–10.3)
CO2: 25 mmol/L (ref 22–32)
Chloride: 106 mmol/L (ref 101–111)
Creatinine, Ser: 0.67 mg/dL (ref 0.44–1.00)
GLUCOSE: 81 mg/dL (ref 65–99)
Potassium: 3.7 mmol/L (ref 3.5–5.1)
SODIUM: 139 mmol/L (ref 135–145)

## 2017-07-06 LAB — TROPONIN I

## 2017-07-06 MED ORDER — IBUPROFEN 400 MG PO TABS
600.0000 mg | ORAL_TABLET | Freq: Once | ORAL | Status: AC
  Administered 2017-07-06: 600 mg via ORAL
  Filled 2017-07-06: qty 2

## 2017-07-06 MED ORDER — ASPIRIN 81 MG PO CHEW
162.0000 mg | CHEWABLE_TABLET | Freq: Once | ORAL | Status: AC
  Administered 2017-07-06: 162 mg via ORAL

## 2017-07-06 MED ORDER — ASPIRIN EC 81 MG PO TBEC: 81.0000 mg | DELAYED_RELEASE_TABLET | Freq: Every day | ORAL | 0 refills | Status: DC

## 2017-07-06 MED ORDER — ASPIRIN 81 MG PO CHEW
CHEWABLE_TABLET | ORAL | Status: AC
  Administered 2017-07-06: 162 mg via ORAL
  Filled 2017-07-06: qty 1
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
## 2017-07-06 NOTE — Telephone Encounter (Signed)
Pt  Reports   Symptoms  Of  Chest  Pain  Earlier  Today   With  Symptoms  Of  Tightness  In  Chest   And  An  Episode  Of  Not being  Able  To  Take  A  Deep  Breath that  Lasted  About  20   mins . She states  She  Had  Some  sweating as  Well .  At this  Time  She  Is  More  Alert and  Oriented  And  Is  Alert and  Speaking in  Complete  sentances .   Dr Dossie Arbourrissman  office  Notified  And  Approved  For  Patient to  Go  To  [facility]      Reason for Disposition . Taking a deep breath makes pain worse  Answer Assessment - Initial Assessment Questions 1. LOCATION: "Where does it hurt?"        L  Side  Under  Breast  More  On  Side   2. RADIATION: "Does the pain go anywhere else?" (e.g., into neck, jaw, arms, back)     Radiates   From  Inside  Of  Breast   To  The  Outside  3. ONSET: "When did the chest pain begin?" (Minutes, hours or days)       30  mins  ago 4. PATTERN "Does the pain come and go, or has it been constant since it started?"  "Does it get worse with exertion?"       Once  It  Started  It  Got  Worse  Got  Better  Gradually   Worse  When  She takes   A  Deep  Breath   5. DURATION: "How long does it last" (e.g., seconds, minutes, hours)   Approx   20  mins   6. SEVERITY: "How bad is the pain?"  (e.g., Scale 1-10; mild, moderate, or severe)    - MILD (1-3): doesn't interfere with normal activities     - MODERATE (4-7): interferes with normal activities or awakens from sleep    - SEVERE (8-10): excruciating pain, unable to do any normal activities       At  His  Time  Is   It  Is  A  3    7. CARDIAC RISK FACTORS: "Do you have any history of heart problems or risk factors for heart disease?" (e.g., prior heart attack, angina; high blood pressure, diabetes, being overweight, high cholesterol, smoking, or strong family history of heart disease)     None 8. PULMONARY RISK FACTORS: "Do you have any history of lung disease?"  (e.g., blood clots in lung, asthma, emphysema, birth control pills)  NO 9. CAUSE: "What do you think is causing the chest pain?"      No  10. OTHER SYMPTOMS: "Do you have any other symptoms?" (e.g., dizziness, nausea, vomiting, sweating, fever, difficulty breathing, cough)       Had   Some   shorteness   And  Breath  When the  Episode  Happened     11. PREGNANCY: "Is there any chance you are pregnant?" "When was your last menstrual period?"        Hysterectomy  Protocols used: CHEST PAIN-A-AH
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
## 2017-07-08 DIAGNOSIS — I7300 Raynaud's syndrome without gangrene: Secondary | ICD-10-CM | POA: Diagnosis not present

## 2017-07-08 DIAGNOSIS — R071 Chest pain on breathing: Secondary | ICD-10-CM | POA: Insufficient documentation

## 2017-07-09 ENCOUNTER — Other Ambulatory Visit: Payer: Self-pay | Admitting: Cardiology

## 2017-07-09 DIAGNOSIS — R071 Chest pain on breathing: Secondary | ICD-10-CM

## 2017-07-15 ENCOUNTER — Ambulatory Visit
Admission: RE | Admit: 2017-07-15 | Discharge: 2017-07-15 | Disposition: A | Discharge status: Home / Self Care | Payer: Medicaid Other | Source: Ambulatory Visit | Attending: Cardiology | Admitting: Cardiology

## 2017-07-15 DIAGNOSIS — I340 Nonrheumatic mitral (valve) insufficiency: Secondary | ICD-10-CM | POA: Insufficient documentation

## 2017-07-15 DIAGNOSIS — E785 Hyperlipidemia, unspecified: Secondary | ICD-10-CM | POA: Insufficient documentation

## 2017-07-15 DIAGNOSIS — R51 Headache: Secondary | ICD-10-CM | POA: Diagnosis present

## 2017-07-15 DIAGNOSIS — R071 Chest pain on breathing: Secondary | ICD-10-CM | POA: Diagnosis not present

## 2017-07-15 DIAGNOSIS — R519 Headache, unspecified: Secondary | ICD-10-CM

## 2017-07-15 NOTE — Progress Notes (Signed)
*  PRELIMINARY RESULTS* Echocardiogram 2D Echocardiogram has been performed.  Beverly BlueHege, Beverly Haley 07/15/2017, 10:45 AM

## 2017-07-16 LAB — PROLACTIN: Prolactin: 6.7 ng/mL (ref 4.8–23.3)

## 2017-08-24 IMAGING — US US TRANSVAGINAL NON-OB
1 series · 13 of 25 positions shown · non-contrast
Comparison: Report from 11/07/2002

CLINICAL DATA: Pelvic pain. Three months duration. History of
endometriosis. Prior endometrial ablation in 4550.



[Series 1: us transvaginal non-ob · 0.24mm/px · 13 of 80 slices shown]
[im 1/80]
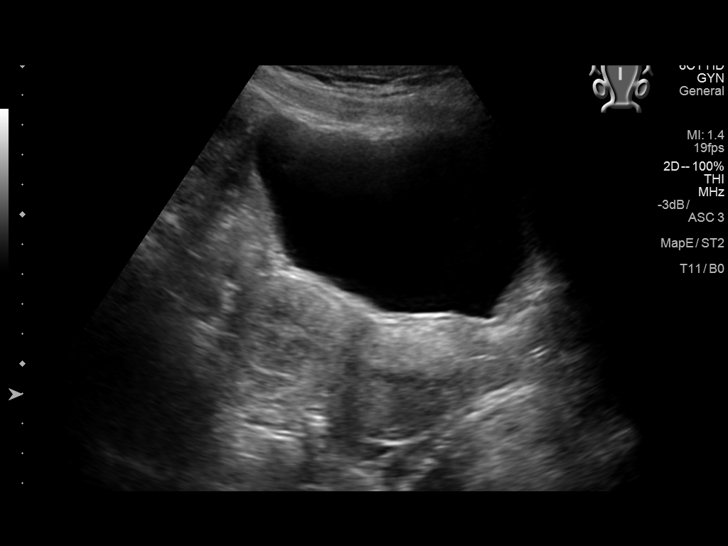
[im 7/80]
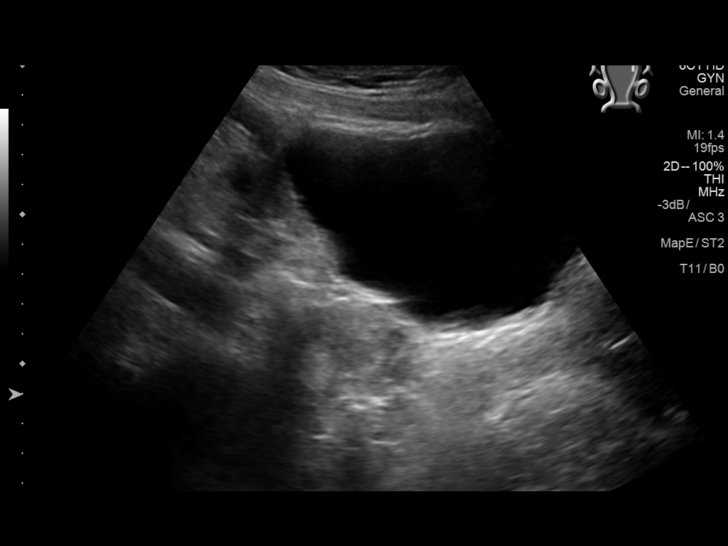
[im 14/80]
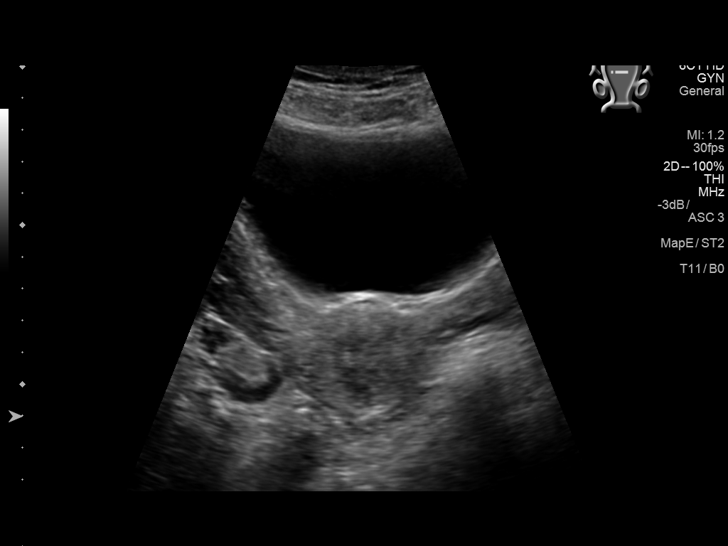
[im 20/80]
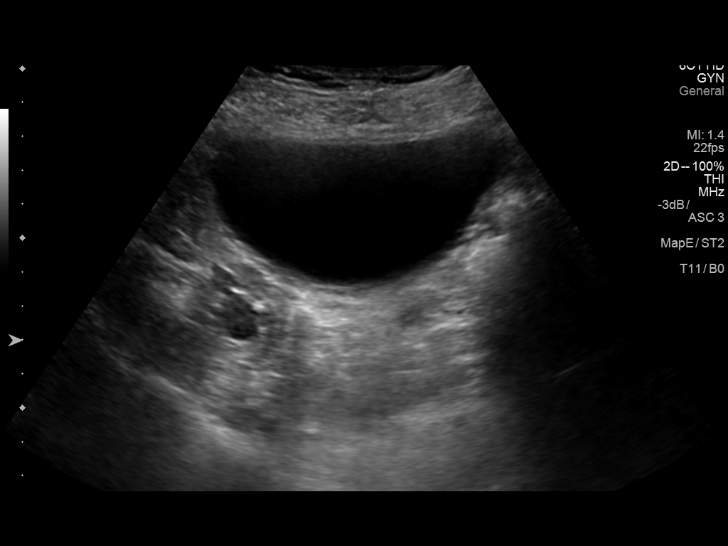
[im 27/80]
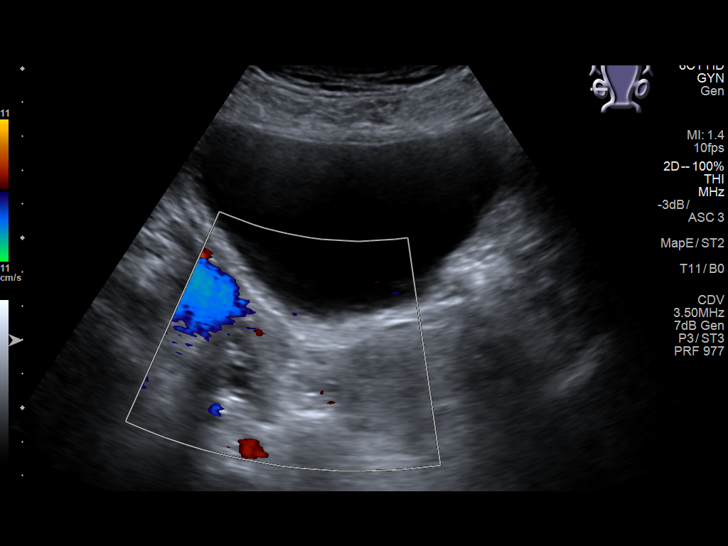
[im 33/80]
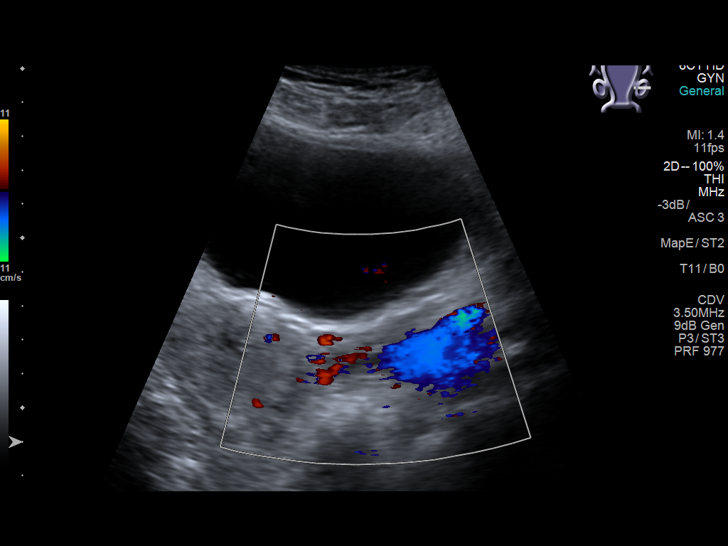
[im 40/80]
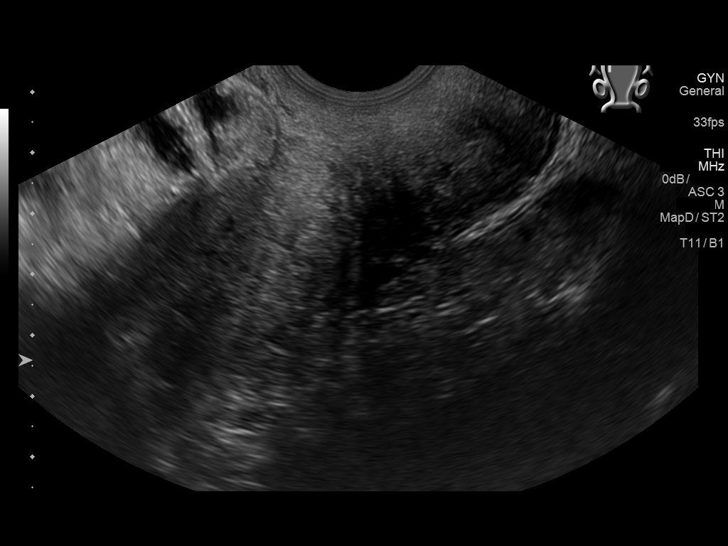
[im 47/80]
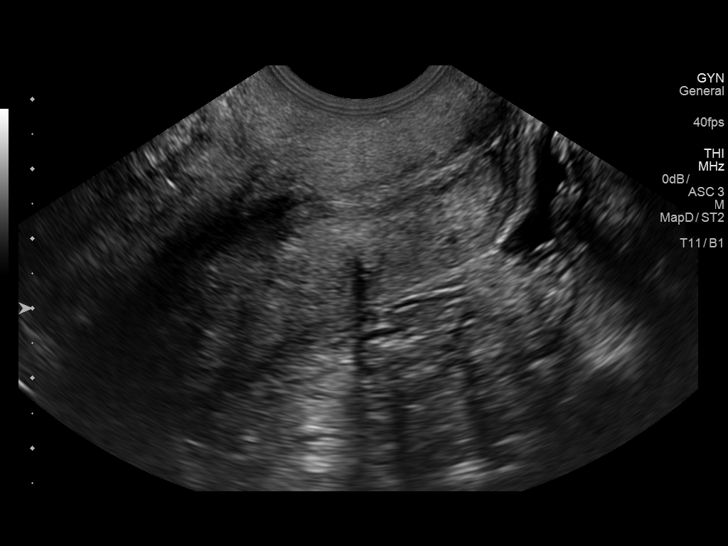
[im 53/80]
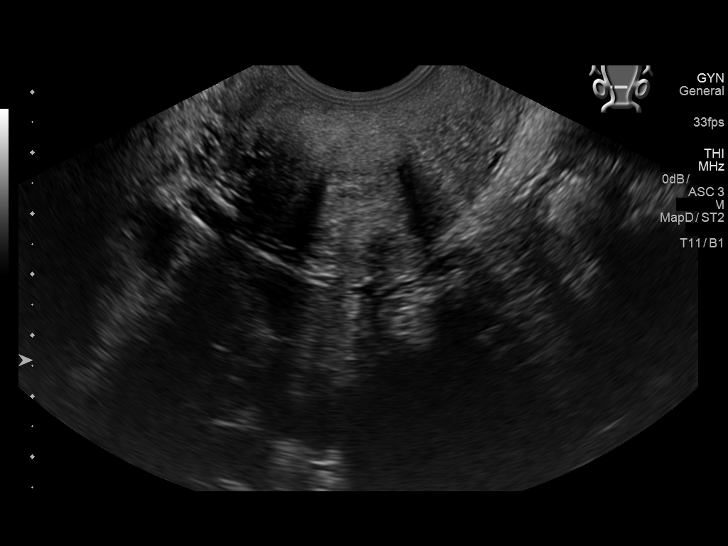
[im 60/80]
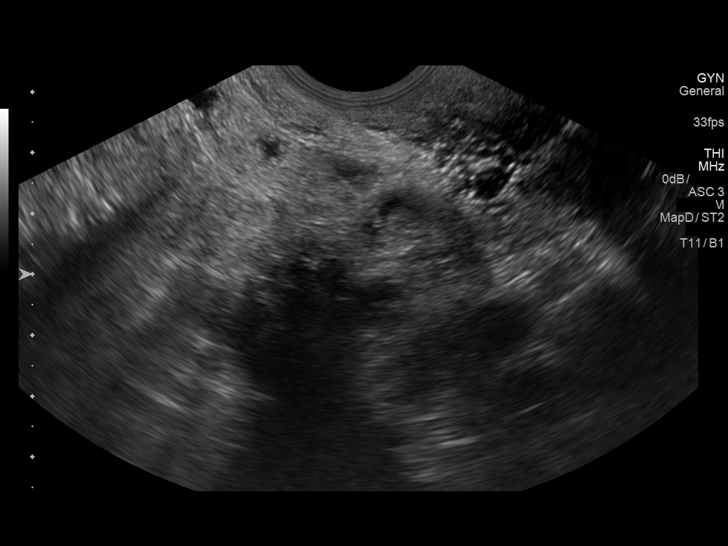
[im 66/80]
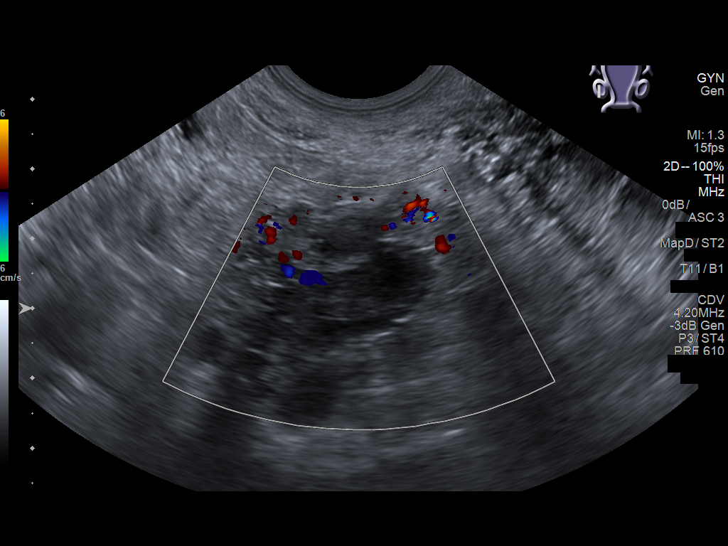
[im 73/80]
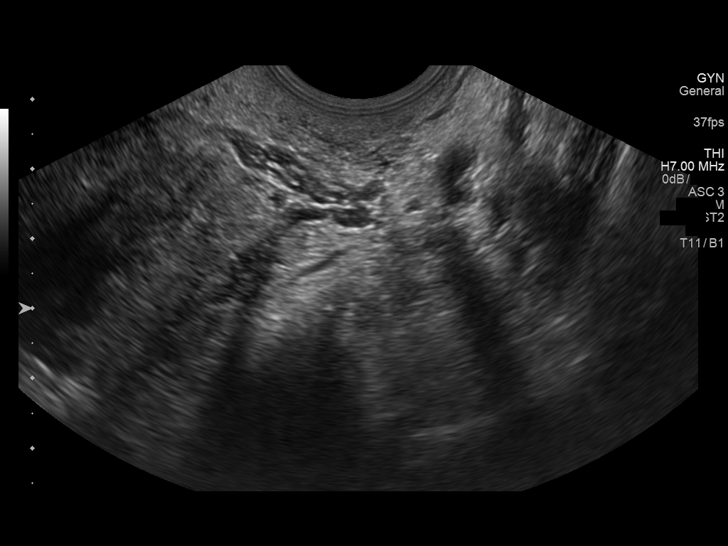
[im 80/80]
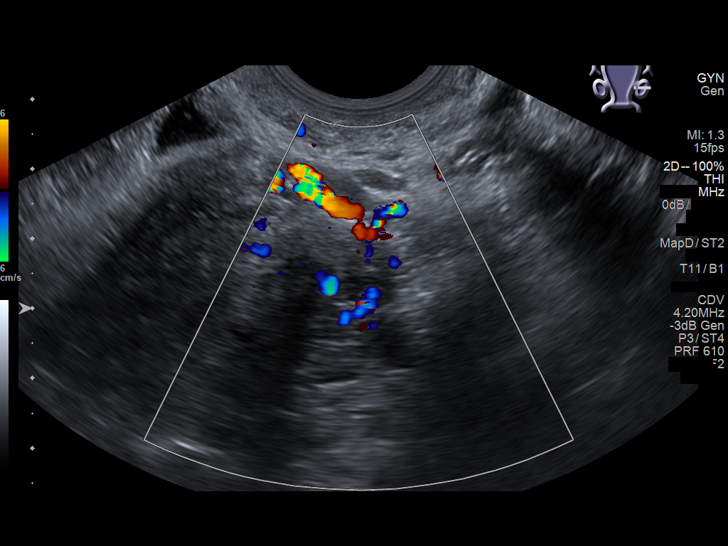

[13 of 25 positions shown; findings below may reference images not displayed]

FINDINGS: Uterus

Measurements: 8.4 by 3.2 by 4.3 cm.. Heterogeneous and somewhat
hypoechoic uterus, with poor definition and scattered areas of
shadowing, possibly due to uterine fibroids.

Endometrium

Thickness: 6 mm the endometrium is very poorly defined and is
measured on image 61.

Right ovary

Measurements: 3.0 by 1.3 by 1.5 cm. Normal appearance/no adnexal
mass.

Left ovary

Measurements: 2.3 by 1.5 by 1.4 cm. Normal appearance/no adnexal
mass.

Other findings

Trace free pelvic fluid as on image 49, possibly physiologic.
IMPRESSION: 1. Endometrial thickness at 6 mm, although the endometrium is very
indistinct.
2. Heterogeneous and somewhat hypoechoic uterine myometrium with
poor definition of tissue planes and scattered areas of shadowing.
This could be due to isoechoic shadowing uterine fibroids.

## 2017-08-27 ENCOUNTER — Encounter: Payer: Self-pay | Admitting: Family Medicine

## 2017-08-27 ENCOUNTER — Ambulatory Visit (INDEPENDENT_AMBULATORY_CARE_PROVIDER_SITE_OTHER): Payer: Medicaid Other | Admitting: Family Medicine

## 2017-08-27 VITALS — BP 111/75 | HR 69 | Temp 99.0°F | Wt 148.7 lb

## 2017-08-27 DIAGNOSIS — R509 Fever, unspecified: Secondary | ICD-10-CM | POA: Diagnosis not present

## 2017-08-27 DIAGNOSIS — F419 Anxiety disorder, unspecified: Secondary | ICD-10-CM | POA: Diagnosis not present

## 2017-08-27 DIAGNOSIS — J069 Acute upper respiratory infection, unspecified: Secondary | ICD-10-CM | POA: Diagnosis not present

## 2017-08-27 LAB — VERITOR FLU A/B WAIVED
INFLUENZA A: NEGATIVE
Influenza B: NEGATIVE

## 2017-08-27 MED ORDER — ALPRAZOLAM 0.25 MG PO TABS: 0.2500 mg | ORAL_TABLET | Freq: Two times a day (BID) | ORAL | 0 refills | Status: DC | PRN

## 2017-08-27 MED ORDER — PREDNISONE 10 MG PO TABS: ORAL_TABLET | ORAL | 0 refills | Status: DC

## 2017-08-27 MED ORDER — BENZONATATE 200 MG PO CAPS: 200.0000 mg | ORAL_CAPSULE | Freq: Two times a day (BID) | ORAL | 0 refills | Status: DC | PRN

## 2017-08-27 MED ORDER — HYDROCOD POLST-CPM POLST ER 10-8 MG/5ML PO SUER: 5.0000 mL | Freq: Two times a day (BID) | ORAL | 0 refills | Status: DC | PRN

## 2017-08-27 MED ORDER — AZITHROMYCIN 250 MG PO TABS: ORAL_TABLET | ORAL | 0 refills | Status: DC

## 2017-08-27 MED ORDER — FLUOXETINE HCL 10 MG PO CAPS: 10.0000 mg | ORAL_CAPSULE | Freq: Every day | ORAL | 1 refills | Status: DC
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
## 2017-08-30 DIAGNOSIS — F419 Anxiety disorder, unspecified: Secondary | ICD-10-CM | POA: Insufficient documentation

## 2017-08-30 NOTE — Patient Instructions (Signed)
Follow up in 1 month   

## 2017-08-30 NOTE — Assessment & Plan Note (Signed)
With significant family stress currently causing situational panic episodes. Long discussion today about options. Pt agreeable to starting prozac daily, xanax prn, and establishing with a counselor for regular sessions. F/u in 1 month for recheck. Risks and benefits as well as precautions reviewed

## 2017-09-24 ENCOUNTER — Ambulatory Visit: Payer: Medicaid Other | Admitting: Family Medicine

## 2018-03-23 ENCOUNTER — Encounter: Payer: Self-pay | Admitting: Physician Assistant

## 2018-03-23 ENCOUNTER — Ambulatory Visit (INDEPENDENT_AMBULATORY_CARE_PROVIDER_SITE_OTHER): Payer: Medicaid Other | Admitting: Physician Assistant

## 2018-03-23 VITALS — BP 105/72 | HR 78 | Wt 150.0 lb

## 2018-03-23 DIAGNOSIS — M659 Synovitis and tenosynovitis, unspecified: Secondary | ICD-10-CM

## 2018-03-23 MED ORDER — MELOXICAM 15 MG PO TABS: 15.0000 mg | ORAL_TABLET | Freq: Every day | ORAL | 0 refills | Status: DC
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
## 2018-03-23 NOTE — Patient Instructions (Addendum)
Thumb Spica Splint  [facility] on [address] in [place], [place] - walk in hours 1:30 - 7:30 PM daily      Wrist Pain, Adult There are many things that can cause wrist pain. Some common causes include:  An injury to the wrist area.  Overuse of the joint.  A condition that causes too much pressure to be put on a nerve in the wrist (carpal tunnel syndrome).  Wear and tear of the joints that happens as a person gets older (osteoarthritis).  Other types of arthritis.  Sometimes, the cause of wrist pain is not known. Often, the pain goes away when you follow your doctor's instructions for helping pain at home, such as resting or icing your wrist. If your wrist pain does not go away, it is important to tell your doctor. Follow these instructions at home:  Rest the wrist area for 48 hours or more, or as long as told by your doctor.  If a splint or elastic bandage has been put on your wrist, use it as told by your doctor. ? Take off the splint or bandage only as told by your doctor. ? Loosen the splint or bandage if your fingers tingle, lose feeling (get numb), or turn cold or blue.  If directed, apply ice to the injured area: ? If you have a removable splint or elastic bandage, remove it as told by your doctor. ? Put ice in a plastic bag. ? Place a towel between your skin and the bag or between your splint or bandage and the bag. ? Leave the ice on for 20 minutes, 2-3 times a day.  Keep your arm raised (elevated) above the level of your heart while you are sitting or lying down.  Take over-the-counter and prescription medicines only as told by your doctor.  Keep all follow-up visits as told by your doctor. This is important. Contact a doctor if:  You have a sudden sharp pain in the wrist, hand, or arm that is different or new.  The swelling or bruising on your wrist or hand gets worse.  Your skin becomes red, gets a rash, or has open sores.  Your pain does not get  better or it gets worse. Get help right away if:  You lose feeling in your fingers or hand.  Your fingers turn white, very red, or cold and blue.  You cannot move your fingers.  You have a fever or chills. This information is not intended to replace advice given to you by your health care provider. Make sure you discuss any questions you have with your health care provider. Document Released: 01/07/2008 Document Revised: 02/14/2016 Document Reviewed: 02/07/2016 Elsevier Interactive Patient Education  [employer]2018 Elsevier Inc.

## 2018-06-08 ENCOUNTER — Ambulatory Visit (INDEPENDENT_AMBULATORY_CARE_PROVIDER_SITE_OTHER): Payer: Medicaid Other | Admitting: Family Medicine

## 2018-06-08 ENCOUNTER — Encounter: Payer: Self-pay | Admitting: Family Medicine

## 2018-06-08 VITALS — BP 123/80 | HR 78 | Temp 97.7°F | Wt 148.0 lb

## 2018-06-08 DIAGNOSIS — F419 Anxiety disorder, unspecified: Secondary | ICD-10-CM | POA: Diagnosis not present

## 2018-06-08 DIAGNOSIS — M25531 Pain in right wrist: Secondary | ICD-10-CM

## 2018-06-08 NOTE — Assessment & Plan Note (Signed)
Not under good control. Advised her to follow up with psychiatry who is writing her medicine. Call with any concerns.

## 2018-06-08 NOTE — Patient Instructions (Signed)
Beverly Haley 06/08/18 1:30PM [address], [place], [place] [address]  Phone: [phone]
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
## 2018-07-03 ENCOUNTER — Other Ambulatory Visit: Payer: Self-pay

## 2018-07-03 ENCOUNTER — Emergency Department: Payer: Medicaid Other

## 2018-07-03 ENCOUNTER — Emergency Department
Admission: EM | Admit: 2018-07-03 | Discharge: 2018-07-03 | Disposition: A | Discharge status: Home / Self Care | Payer: Medicaid Other | Attending: Emergency Medicine | Admitting: Emergency Medicine

## 2018-07-03 DIAGNOSIS — S0001XA Abrasion of scalp, initial encounter: Secondary | ICD-10-CM | POA: Insufficient documentation

## 2018-07-03 DIAGNOSIS — Y9301 Activity, walking, marching and hiking: Secondary | ICD-10-CM | POA: Diagnosis not present

## 2018-07-03 DIAGNOSIS — Y998 Other external cause status: Secondary | ICD-10-CM | POA: Insufficient documentation

## 2018-07-03 DIAGNOSIS — Z91010 Allergy to peanuts: Secondary | ICD-10-CM | POA: Insufficient documentation

## 2018-07-03 DIAGNOSIS — Y92009 Unspecified place in unspecified non-institutional (private) residence as the place of occurrence of the external cause: Secondary | ICD-10-CM

## 2018-07-03 DIAGNOSIS — S299XXA Unspecified injury of thorax, initial encounter: Secondary | ICD-10-CM | POA: Diagnosis present

## 2018-07-03 DIAGNOSIS — W19XXXA Unspecified fall, initial encounter: Secondary | ICD-10-CM

## 2018-07-03 DIAGNOSIS — W01198A Fall on same level from slipping, tripping and stumbling with subsequent striking against other object, initial encounter: Secondary | ICD-10-CM | POA: Diagnosis not present

## 2018-07-03 DIAGNOSIS — Y9289 Other specified places as the place of occurrence of the external cause: Secondary | ICD-10-CM | POA: Diagnosis not present

## 2018-07-03 DIAGNOSIS — Z79899 Other long term (current) drug therapy: Secondary | ICD-10-CM | POA: Diagnosis not present

## 2018-07-03 DIAGNOSIS — S2241XA Multiple fractures of ribs, right side, initial encounter for closed fracture: Secondary | ICD-10-CM

## 2018-07-03 MED ORDER — CYCLOBENZAPRINE HCL 10 MG PO TABS
10.0000 mg | ORAL_TABLET | Freq: Once | ORAL | Status: AC
  Administered 2018-07-03: 10 mg via ORAL
  Filled 2018-07-03: qty 1

## 2018-07-03 MED ORDER — OXYCODONE-ACETAMINOPHEN 5-325 MG PO TABS
1.0000 | ORAL_TABLET | Freq: Once | ORAL | Status: AC
  Administered 2018-07-03: 1 via ORAL
  Filled 2018-07-03: qty 1

## 2018-07-03 MED ORDER — HYDROCODONE-ACETAMINOPHEN 5-325 MG PO TABS: 1.0000 | ORAL_TABLET | Freq: Four times a day (QID) | ORAL | 0 refills | Status: AC | PRN

## 2018-07-03 MED ORDER — CYCLOBENZAPRINE HCL 10 MG PO TABS: 10.0000 mg | ORAL_TABLET | Freq: Three times a day (TID) | ORAL | 0 refills | Status: AC | PRN

## 2018-07-03 NOTE — ED Triage Notes (Addendum)
Pt comes via POV from home after a fall. Pt states she was walking down her steps and slipped and fell. Pt denies LOC. Pt states she fell on her right side and hit her ribs. Pt states severe pain in ribs on right side. Pt denies pain anywhere else.  Pt is A&OX4. Pt tearful in triage.

## 2018-07-03 NOTE — ED Provider Notes (Signed)
[facility] Emergency Department Provider Note ____________________________________________  Time seen: 1235  I have reviewed the triage vital signs and the nursing notes.  HISTORY  Chief Complaint  Fall  HPI Beverly Haley is a 47 y.o. female who presents to the ED for evaluation of right-sided rib pain.  Patient describes a mechanical fall last night on the steps outside of the back porch.  He apparently slipped due to wet conditions.  She also describes an abrasion to the right side of her scalp.  She denies any loss of consciousness, nausea, vomiting, or dizziness.  She presents now with increased pain with movement and deep breaths to the right ribs.  She denies any frank chest pain, shortness of breath, nausea, vomiting, or dizziness.  No other injuries reported at this time.  No alleviating measures have been attempted since the incident.  Past Medical History:  Diagnosis Date  . Bulky or enlarged uterus 05/15/2016  . Hip pain   . Hyperlipidemia   . Migraine   . 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 Status post laparoscopic assisted vaginal hysterectomy (LAVH) 06/16/2016   LAVHBSO    Patient Active Problem List   Diagnosis Date Noted  . Anxiety 08/30/2017  . Raynaud's phenomenon without gangrene 07/08/2017  . 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 Endometriosis 12/02/2016    Past Surgical History:  Procedure Laterality Date  . APPENDECTOMY    . BREAST SURGERY    . HYSTEROSCOPY WITH NOVASURE N/A 06/16/2016   Procedure: HYSTEROSCOPY WITH NOVASURE;  Surgeon: Herold HarmsMartin A Defrancesco, MD;  Location: [facility] ORS;  Service: Gynecology;  Laterality: N/A;  . LAPAROSCOPIC VAGINAL HYSTERECTOMY WITH SALPINGECTOMY N/A 12/08/2016   Procedure: LAPAROSCOPIC ASSISTED VAGINAL HYSTERECTOMY WITH BILATERAL SALPINGECTOMY, RIGHT OOPHERECTOMY;  Surgeon: Herold Harmsefrancesco, Martin A, MD;  Location: [facility] ORS;  Service: Gynecology;   Laterality: N/A;  . LAPAROSCOPY N/A 06/16/2016   Procedure: LAPAROSCOPY DIAGNOSTIC WITH BIOPSIES;  Surgeon: Herold HarmsMartin A Defrancesco, MD;  Location: [facility] ORS;  Service: Gynecology;  Laterality: N/A;  . PLACEMENT OF BREAST IMPLANTS      Prior to Admission medications   Medication Sig Start Date End Date Taking? Authorizing Provider  amLODipine (NORVASC) 2.5 MG tablet Take 2.5 mg by mouth daily. 07/08/17 07/08/18  [provider]  Biotin 10 MG CAPS Take by mouth.    [provider]  Capsicum, Cayenne, (CAYENNE PO) Take by mouth.    [provider]  cyclobenzaprine (FLEXERIL) 10 MG tablet Take 1 tablet (10 mg total) by mouth 3 (three) times daily as needed for up to 10 days for muscle spasms. 07/03/18 07/13/18  Gonsalo Cuthbertson, Charlesetta IvoryJenise V Bacon, PA-C  HYDROcodone-acetaminophen (NORCO/VICODIN) 5-325 MG tablet Take 1 tablet by mouth every 6 (six) hours as needed for up to 3 days for moderate pain. 07/03/18 07/06/18  Hildagarde Holleran, Charlesetta IvoryJenise V Bacon, PA-C  Multiple Vitamin (MULTIVITAMIN WITH MINERALS) TABS tablet Take 7 tablets by mouth daily. 1 PACKET OF VITAMINS THAT CONTAIN 7 DIFFERENT VITAMINS    [provider]   Allergies Peanut-containing drug products  Family History  Problem Relation Age of Onset  . Migraines Mother   . Hyperlipidemia Father   . Hypertension Father   . Breast cancer Neg Hx   . Ovarian cancer Neg Hx   . Colon cancer Neg Hx   . Diabetes Neg Hx     Social History Social History   Tobacco Use  . Smoking status: Never Smoker  .  Smokeless tobacco: Never Used  Substance Use Topics  . Alcohol use: Yes    Alcohol/week: 7.0 standard drinks    Types: 7 Shots of liquor per week  . Drug use: No    Review of Systems  Constitutional: Negative for fever. Eyes: Negative for visual changes. ENT: Negative for sore throat. Cardiovascular: Negative for chest pain. Respiratory: Negative for shortness of breath. Gastrointestinal: Negative for abdominal pain,  vomiting and diarrhea. Genitourinary: Negative for dysuria. Musculoskeletal: Negative for back pain. Reports right rib pain as above Skin: Negative for rash. Scalp laceration as above Neurological: Negative for headaches, focal weakness or numbness. ____________________________________________  PHYSICAL EXAM:  VITAL SIGNS: ED Triage Vitals [07/03/18 1228]  Enc Vitals Group     BP 123/72     Pulse Rate 72     Resp 18     Temp 98 F (36.7 C)     Temp Source Oral     SpO2 97 %     Weight 140 lb (63.5 kg)     Height 5' 6.5" (1.689 m)     Head Circumference      Peak Flow      Pain Score 10     Pain Loc      Pain Edu?      Excl. in GC?     Constitutional: Alert and oriented. Well appearing and in no distress. Head: Normocephalic and atraumatic. Scalp abrasion to the right parietal region. No active bleeding. No battle's sign  Eyes: Conjunctivae are normal. Normal extraocular movements. No periorbital ecchymosis.  Neck: Supple. No thyromegaly.  No distracting midline tenderness, crepitus, or deformity. Cardiovascular: Normal rate, regular rhythm. Normal distal pulses. Respiratory: Normal respiratory effort. No wheezes/rales/rhonchi. Gastrointestinal: Soft and nontender. No distention. Musculoskeletal: Nontender with normal range of motion in all extremities.  Right rib with some early ecchymosis noted to the lateral aspect of the region near the 6 ribs. Neurologic:  Normal gait without ataxia. Normal speech and language. No gross focal neurologic deficits are appreciated. Skin:  Skin is warm, dry and intact. No rash noted. ____________________________________________   RADIOLOGY  Right Rib w/ CXR  IMPRESSION: Lateral right sixth and seventh rib fractures. Mild bibasilar atelectasis. No pneumothorax. No hemothorax. ____________________________________________  PROCEDURES  Procedures Flexeril 10 mg PO Percocet 5-325 mg  PO ____________________________________________  INITIAL IMPRESSION / ASSESSMENT AND PLAN / ED COURSE  Patient with ED evaluation of injury sustained following a mechanical fall.  Patient's x-ray confirms 2 distinct fractures between the lateral sixth and seventh ribs on the right.  Some mild atelectasis is also appreciated.  Patient will be discharged with prescriptions for Flexeril and hydrocodone to take as needed.  She may also take over-the-counter ibuprofen for additional pain relief.  Fracture care instructions are provided.  Patient is discharged to follow-up with primary provider or return to the ED as needed.  I reviewed the patient's prescription history over the last 12 months in the multi-state controlled substances database(s) that includes [place], [place], [place], [place], [place], [place], [place], [place], [place], [place], [place], [place], [place], and [place].  Results were notable for no current/recent prescriptions. ____________________________________________  FINAL CLINICAL IMPRESSION(S) / ED DIAGNOSES  Final diagnoses:  Fall in home, initial encounter  Closed fracture of multiple ribs of right side, initial encounter  Abrasion of scalp, initial encounter      Lissa Hoard, PA-C 07/03/18 1600    Minna Antis, MD 07/04/18 [phone]

## 2018-07-03 NOTE — Discharge Instructions (Signed)
You have been evaluated for a fall at home last night. You have 2 rib fractures. Take the prescription medicines for pain and muscle spasm. You may also take OTC ibuprofen for inflammation relief. Apply ice and moist heat to reduce symptoms. Use a pillow to hug for sneezing/coughing. Follow-up with your provider for ongoing symptoms. Return as needed.

## 2018-07-03 NOTE — ED Notes (Signed)
See triage note  Presents s/p fall   States she fell down steps last pm  Hit her right lateral rib area.  having increased pain with movement and inspiration

## 2018-07-08 ENCOUNTER — Encounter: Payer: Self-pay | Admitting: Nurse Practitioner

## 2018-07-08 ENCOUNTER — Ambulatory Visit (INDEPENDENT_AMBULATORY_CARE_PROVIDER_SITE_OTHER): Payer: Medicaid Other | Admitting: Nurse Practitioner

## 2018-07-08 DIAGNOSIS — S2241XD Multiple fractures of ribs, right side, subsequent encounter for fracture with routine healing: Secondary | ICD-10-CM | POA: Diagnosis not present

## 2018-07-08 DIAGNOSIS — S2239XA Fracture of one rib, unspecified side, initial encounter for closed fracture: Secondary | ICD-10-CM | POA: Insufficient documentation

## 2018-07-08 DIAGNOSIS — S2249XA Multiple fractures of ribs, unspecified side, initial encounter for closed fracture: Secondary | ICD-10-CM | POA: Insufficient documentation

## 2018-07-08 NOTE — Assessment & Plan Note (Addendum)
Imaging showed 2 distinct fractures between lateral sixth and seventh ribs on right.  Educated on deep breathing exercises.  Will refill Norco #20 pills for 5 day supply only during this acute injury phase for pain control.  She is to return to office or ER for worsening pain, SOB, or cough.

## 2018-07-08 NOTE — Patient Instructions (Signed)

## 2018-07-08 NOTE — Progress Notes (Signed)
BP 113/78   Pulse 87   Temp (!) 97.5 F (36.4 C) (Oral)   Ht 5' 5.75" (1.67 m)   Wt 152 lb (68.9 kg)   LMP 08/04/2016   SpO2 98%   BMI 24.72 kg/m    Subjective:    [name], female    DOB: 06/05/1971, 47 y.o.   MRN: [id number]  HPI: Beverly Haley is a 47 y.o. female presents for hospital follow-up  Chief Complaint  Patient presents with  . Hospitalization Follow-up    Patient feel going up wet leave covered concrete steps, broke 3 ribs on left side. was [facility]   ER FOLLOW UP [facility] 07/[facility]/18 after she had a fall on 07/02/18.  Mechanical fall on steps outside on her back porch.  She presented to ER with an abrasion to right scalp and right rib pain.  Imaging was performed.  CXR noted "2 distinct fractures between lateral sixth and seventh ribs on right".  She was discharged home with pain medication.  Reports she did not obtain an incentive spirometer.  At this time she report she is doing "okay".  Continues to have rib pain, but reports medicine "helps a little".  States her husband is having to help her get out of bed in morning.  Discussed at length with her the importance of continuing to be mobile every day and to perform deep breathing exercises frequently throughout day well supporting right rib cage (demonstrated this to her in room).  Also discussed importance of supporting area while coughing or sneezing, to aid with discomfort.  Denies SOB, cough, hemoptysis, chills, or fever.  She reports she needs for Norco.  Discussed with her that would write for 5 days of medication only, educated on risks/benefits. Hospital/facility: [facility] Diagnosis: Rib fractures x 2 between lateral sixth and seventh ribs on right Procedures/tests: CXR Consultants: none New medications: Flexeril and Norco Discharge instructions:  Rest Status: stable   Relevant past medical, surgical, family and social history reviewed and updated as indicated. Interim  medical history since our last visit reviewed. Allergies and medications reviewed and updated.  Review of Systems  Constitutional: Negative for activity change, appetite change, fatigue and fever.  Respiratory: Negative for cough, chest tightness, shortness of breath and wheezing.   Cardiovascular: Negative for chest pain, palpitations and leg swelling.  Gastrointestinal: Negative for abdominal distention, abdominal pain, constipation, diarrhea, nausea and vomiting.  Musculoskeletal:       Right sided pain  Neurological: Negative for dizziness, syncope, light-headedness, numbness and headaches.  Psychiatric/Behavioral: Negative.     Per HPI unless specifically indicated above     Objective:    BP 113/78   Pulse 87   Temp (!) 97.5 F (36.4 C) (Oral)   Ht 5' 5.75" (1.67 m)   Wt 152 lb (68.9 kg)   LMP 08/04/2016   SpO2 98%   BMI 24.72 kg/m   Wt Readings from Last 3 Encounters:  07/08/18 152 lb (68.9 kg)  07/03/18 140 lb (63.5 kg)  06/08/18 148 lb (67.1 kg)    Physical Exam  Constitutional: She is oriented to person, place, and time. She appears well-developed and well-nourished.  HENT:  Head: Normocephalic. Head is with abrasion and with contusion.    Right Ear: Hearing normal.  Left Ear: Hearing normal.  Nose: Nose normal.  Eyes: Pupils are equal, round, and reactive to light. Conjunctivae and EOM are normal. Right eye exhibits no discharge. Left  eye exhibits no discharge.  Neck: Normal range of motion. Neck supple. No JVD present. Carotid bruit is not present. No thyromegaly present.  Cardiovascular: Normal rate, regular rhythm and normal heart sounds.  Pulmonary/Chest: Effort normal and breath sounds normal.  Abdominal: Soft. Bowel sounds are normal.  Lymphadenopathy:    She has no cervical adenopathy.  Neurological: She is alert and oriented to person, place, and time. She has normal strength. She displays a negative Romberg sign.  Skin: Skin is warm and dry.       Psychiatric: She has a normal mood and affect. Her behavior is normal. Judgment and thought content normal.  Nursing note and vitals reviewed.   Results for orders placed or performed in visit on 08/27/17  Veritor Flu A/B Waived  Result Value Ref Range   Influenza A Negative Negative   Influenza B Negative Negative      Assessment & Plan:   Problem List Items Addressed This Visit      Musculoskeletal and Integument   Rib fractures    Imaging showed 2 distinct fractures between lateral sixth and seventh ribs on right.  Educated on deep breathing exercises.  Will refill Norco #20 pills for 5 day supply only during this acute injury phase for pain control.  She is to return to office or ER for worsening pain, SOB, or cough.         Controlled substance.  Checked data base and no other refills of controlled substances noted.  Last Norco fill was for 12 tablets during ER visit 07/03/18.  Follow up plan: Return if symptoms worsen or fail to improve.

## 2018-07-15 ENCOUNTER — Encounter: Payer: Medicaid Other | Admitting: Family Medicine

## 2018-08-12 ENCOUNTER — Telehealth: Payer: Self-pay

## 2018-08-12 NOTE — Telephone Encounter (Signed)
Appointment moved to 08/13/18 with Roosvelt Maser PA-C

## 2018-08-12 NOTE — Telephone Encounter (Signed)
Received fax from [employer] stating pt was requesting refill on Escitalopram 20 mg tablets. Sig is daily. Not on current medication list. Please advise.

## 2018-08-12 NOTE — Telephone Encounter (Signed)
I have never written that RX for her. She will need a follow up to discuss it for refills.

## 2018-08-13 ENCOUNTER — Encounter: Payer: Self-pay | Admitting: Family Medicine

## 2018-08-13 ENCOUNTER — Ambulatory Visit (INDEPENDENT_AMBULATORY_CARE_PROVIDER_SITE_OTHER): Payer: Medicaid Other | Admitting: Family Medicine

## 2018-08-13 VITALS — BP 134/81 | HR 67 | Temp 98.2°F | Wt 149.0 lb

## 2018-08-13 DIAGNOSIS — M25531 Pain in right wrist: Secondary | ICD-10-CM

## 2018-08-13 DIAGNOSIS — G8929 Other chronic pain: Secondary | ICD-10-CM

## 2018-08-13 DIAGNOSIS — F332 Major depressive disorder, recurrent severe without psychotic features: Secondary | ICD-10-CM | POA: Diagnosis not present

## 2018-08-13 DIAGNOSIS — F410 Panic disorder [episodic paroxysmal anxiety] without agoraphobia: Secondary | ICD-10-CM

## 2018-08-13 DIAGNOSIS — F4310 Post-traumatic stress disorder, unspecified: Secondary | ICD-10-CM | POA: Diagnosis not present

## 2018-08-13 DIAGNOSIS — F339 Major depressive disorder, recurrent, unspecified: Secondary | ICD-10-CM | POA: Insufficient documentation

## 2018-08-13 MED ORDER — BUSPIRONE HCL 10 MG PO TABS: 10.0000 mg | ORAL_TABLET | Freq: Three times a day (TID) | ORAL | 0 refills | Status: DC

## 2018-08-13 MED ORDER — HYDROXYZINE HCL 25 MG PO TABS: 25.0000 mg | ORAL_TABLET | Freq: Three times a day (TID) | ORAL | 0 refills | Status: DC | PRN
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
## 2018-08-15 NOTE — Assessment & Plan Note (Signed)
Severe, without active SI/HI. Will refer to Psychiatry for further management given severity of sxs and past trauma. Start buspar and prn hydroxyzine and follow up in 1 month if not already established with Psychiatry. Discussed mobile crisis resources, [facility] walk in hours, ER if having serious SI. Strict ER precautions reviewed 

## 2018-08-15 NOTE — Assessment & Plan Note (Signed)
Severe, without active SI/HI. Will refer to Psychiatry for further management given severity of sxs and past trauma. Start buspar and prn hydroxyzine and follow up in 1 month if not already established with Psychiatry. Discussed mobile crisis resources, [facility] walk in hours, ER if having serious SI. Strict ER precautions reviewed

## 2018-08-17 ENCOUNTER — Telehealth: Payer: Self-pay | Admitting: Family Medicine

## 2018-08-17 NOTE — Telephone Encounter (Signed)
Copied from CRM [id number]. Topic: Referral - Status >> Aug 17, 2018 11:10 AM Jolayne Haines L wrote: Reason for CRM: Patient is calling to see if Dr Laural Benes has decided on where her referral is going to go since she can not go to [facility] due to the balance she has with them. Also, she said she has not heard from anyone regarding her PTSD referral. Please Advise.

## 2018-08-17 NOTE — Telephone Encounter (Signed)
Sending to referral coordinator to contact patient regarding referrals.

## 2018-08-20 ENCOUNTER — Ambulatory Visit: Payer: Medicaid Other | Admitting: Family Medicine
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
## 2018-09-10 ENCOUNTER — Ambulatory Visit: Payer: Medicaid Other | Admitting: Family Medicine

## 2018-09-14 ENCOUNTER — Telehealth: Payer: Self-pay | Admitting: Family Medicine

## 2018-09-14 ENCOUNTER — Other Ambulatory Visit: Payer: Self-pay | Admitting: Family Medicine

## 2018-09-14 NOTE — Telephone Encounter (Signed)
Requested medication (s) are due for refill today:  yes  Requested medication (s) are on the active medication list:  yes  Future visit scheduled:  No; the pt. Had an appt. On 09/10/18, but didn't show.  Last Refill: 08/13/18 #90; no refills; on both meds  Attempted to contact pt.; left vm re: needing a 1 mo. F/u, unless has already seen by Psychiatry.  Requested Prescriptions  Pending Prescriptions Disp Refills   busPIRone (BUSPAR) 10 MG tablet 90 tablet 0    Sig: Take 1 tablet (10 mg total) by mouth 3 (three) times daily.     Psychiatry: Anxiolytics/Hypnotics - Non-controlled Passed - 09/14/2018  9:07 AM      Passed - Valid encounter within last 6 months    Recent Outpatient Visits          1 month ago Severe episode of recurrent major depressive disorder, without psychotic features [facility])   [facility] Particia Nearing, PA-C   2 months ago Closed fracture of multiple ribs of right side with routine healing, subsequent encounter   [facility] [place], Corrie Dandy T, NP   3 months ago Right wrist pain   [facility] [place], [place], DO   5 months ago Tenosynovitis   [facility] Osvaldo Angst M, [place]   1 year ago Upper respiratory tract infection, unspecified type   [facility], Salley Hews, [place]            hydrOXYzine (ATARAX/VISTARIL) 25 MG tablet 90 tablet 0    Sig: Take 1 tablet (25 mg total) by mouth 3 (three) times daily as needed.     Ear, Nose, and Throat:  Antihistamines Passed - 09/14/2018  9:07 AM      Passed - Valid encounter within last 12 months    Recent Outpatient Visits          1 month ago Severe episode of recurrent major depressive disorder, without psychotic features [facility])   [facility] Particia Nearing, PA-C   2 months ago Closed fracture of multiple ribs of right side with routine healing, subsequent encounter   [facility] [place], Dorie Rank, NP    3 months ago Right wrist pain   [facility] [place], [place], DO   5 months ago Tenosynovitis   [facility] Trey Sailors, [place]   1 year ago Upper respiratory tract infection, unspecified type   [facility], [place], [place]

## 2018-09-14 NOTE — Telephone Encounter (Signed)
Copied from CRM [id number]. Topic: General - Other >> Sep 14, 2018  9:04 AM Herby Abraham C wrote: Reason for CRM: pt called in to follow up. Pt says that she was seen and started on 2 medications. Pt was advised to follow up with provider Maurice March but pt missed her apt. Pt says that medication is working well for her. She feels that it is helping. Pt requested a refill on medication. Pt would like to know if fu ov is still needed OR if provider could just send in her refill?

## 2018-09-14 NOTE — Telephone Encounter (Signed)
Needs f/u as discussed. Looks like she's scheduled for tomorrow but please call and confirm that she does need this appt

## 2018-09-14 NOTE — Telephone Encounter (Unsigned)
Copied from CRM [id number]. Topic: Quick Communication - Rx Refill/Question >> Sep 14, 2018  9:02 AM Herby Abraham C wrote: Medication: busPIRone (BUSPAR) 10 MG tablet and also hydrOXYzine (ATARAX/VISTARIL) 25 MG tablet [  Has the patient contacted their pharmacy? No  (Agent: If no, request that the patient contact the pharmacy for the refill.) (Agent: If yes, when and what did the pharmacy advise?)  Preferred Pharmacy (with phone number or street name): [facility] - [place], [place] - [address]  Agent: Please be advised that RX refills may take up to 3 business days. We ask that you follow-up with your pharmacy.

## 2018-09-15 ENCOUNTER — Ambulatory Visit: Payer: Medicaid Other | Admitting: Family Medicine

## 2018-09-15 ENCOUNTER — Encounter: Payer: Self-pay | Admitting: Family Medicine

## 2018-09-15 VITALS — BP 101/70 | HR 90 | Temp 98.6°F | Ht 65.75 in | Wt 149.0 lb

## 2018-09-15 DIAGNOSIS — F419 Anxiety disorder, unspecified: Secondary | ICD-10-CM

## 2018-09-15 DIAGNOSIS — F332 Major depressive disorder, recurrent severe without psychotic features: Secondary | ICD-10-CM

## 2018-09-15 MED ORDER — HYDROXYZINE HCL 25 MG PO TABS: 25.0000 mg | ORAL_TABLET | Freq: Three times a day (TID) | ORAL | 1 refills | Status: DC | PRN

## 2018-09-15 MED ORDER — BUSPIRONE HCL 10 MG PO TABS: 10.0000 mg | ORAL_TABLET | Freq: Three times a day (TID) | ORAL | 1 refills | Status: DC

## 2018-09-15 NOTE — Telephone Encounter (Signed)
Saw pt in clinic today and took care of refills

## 2018-09-15 NOTE — Progress Notes (Signed)
BP 101/70   Pulse 90   Temp 98.6 F (37 C) (Oral)   Ht 5' 5.75" (1.67 m)   Wt 149 lb (67.6 kg)   LMP 08/04/2016   SpO2 95%   BMI 24.23 kg/m    Subjective:    Patient ID: HEILYN GAGNE, female    DOB: 10-Aug-1970, 48 y.o.   MRN: [id number]  HPI: KACELYN FIEST is a 48 y.o. female  Chief Complaint  Patient presents with  . Follow-up  . Depression  . Anxiety   Here today for depression and anxiety f/u. Seeing Psychiatry in 2 weeks. Was started last month on hydroxyzine prn and buspar TID. Doing very well on this regimen. Taking both together TID. Fiance with her today and states he's noticed a major difference and that she's more calm and overall happy. Denies side effects, SI/HI, severe mood swings.   Depression screen [facility] 2/9 09/15/2018 08/13/2018  Decreased Interest 2 3  Down, Depressed, Hopeless 1 3  PHQ - 2 Score 3 6  Altered sleeping 3 3  Tired, decreased energy 2 2  Change in appetite 2 3  Feeling bad or failure about yourself  2 3  Trouble concentrating 2 3  Moving slowly or fidgety/restless 1 3  Suicidal thoughts 1 2  PHQ-9 Score 16 25    Relevant past medical, surgical, family and social history reviewed and updated as indicated. Interim medical history since our last visit reviewed. Allergies and medications reviewed and updated.  Review of Systems  Per HPI unless specifically indicated above     Objective:    BP 101/70   Pulse 90   Temp 98.6 F (37 C) (Oral)   Ht 5' 5.75" (1.67 m)   Wt 149 lb (67.6 kg)   LMP 08/04/2016   SpO2 95%   BMI 24.23 kg/m   Wt Readings from Last 3 Encounters:  09/15/18 149 lb (67.6 kg)  08/13/18 149 lb (67.6 kg)  07/08/18 152 lb (68.9 kg)    Physical Exam Vitals signs and nursing note reviewed.  Constitutional:      Appearance: Normal appearance. She is not ill-appearing.  HENT:     Head: Atraumatic.  Eyes:     Extraocular Movements: Extraocular movements intact.     Conjunctiva/sclera: Conjunctivae  normal.  Neck:     Musculoskeletal: Normal range of motion and neck supple.  Cardiovascular:     Rate and Rhythm: Normal rate and regular rhythm.     Heart sounds: Normal heart sounds.  Pulmonary:     Effort: Pulmonary effort is normal.     Breath sounds: Normal breath sounds.  Musculoskeletal: Normal range of motion.  Skin:    General: Skin is warm and dry.  Neurological:     Mental Status: She is alert and oriented to person, place, and time.  Psychiatric:        Mood and Affect: Mood normal.        Thought Content: Thought content normal.        Judgment: Judgment normal.     Results for orders placed or performed in visit on 08/27/17  Veritor Flu A/B Waived  Result Value Ref Range   Influenza A Negative Negative   Influenza B Negative Negative      Assessment & Plan:   Problem List Items Addressed This Visit      Other   Anxiety - Primary    Significantly improved on hydroxyzine and buspar regimen. Continue current regimen and  est with Psychiatry in 2 weeks as scheduled      Relevant Medications   busPIRone (BUSPAR) 10 MG tablet   hydrOXYzine (ATARAX/VISTARIL) 25 MG tablet   Major depression, recurrent (HCC)    Much improved with hydroxyzine and buspar. Conitnue current regimen and await establishing with Psychiatry in 2 weeks      Relevant Medications   busPIRone (BUSPAR) 10 MG tablet   hydrOXYzine (ATARAX/VISTARIL) 25 MG tablet       Follow up plan: Return for CPE.

## 2018-09-21 NOTE — Assessment & Plan Note (Signed)
Much improved with hydroxyzine and buspar. Conitnue current regimen and await establishing with Psychiatry in 2 weeks

## 2018-09-21 NOTE — Assessment & Plan Note (Signed)
Significantly improved on hydroxyzine and buspar regimen. Continue current regimen and est with Psychiatry in 2 weeks as scheduled

## 2018-09-30 ENCOUNTER — Ambulatory Visit: Payer: Medicaid Other | Admitting: Psychiatry

## 2018-10-02 ENCOUNTER — Other Ambulatory Visit: Payer: Self-pay

## 2018-10-02 ENCOUNTER — Encounter: Payer: Self-pay | Admitting: Emergency Medicine

## 2018-10-02 ENCOUNTER — Emergency Department
Admission: EM | Admit: 2018-10-02 | Discharge: 2018-10-03 | Disposition: A | Discharge status: Home / Self Care | Payer: Medicaid Other | Attending: Emergency Medicine | Admitting: Emergency Medicine

## 2018-10-02 DIAGNOSIS — S0083XA Contusion of other part of head, initial encounter: Secondary | ICD-10-CM | POA: Insufficient documentation

## 2018-10-02 DIAGNOSIS — Y9301 Activity, walking, marching and hiking: Secondary | ICD-10-CM | POA: Diagnosis not present

## 2018-10-02 DIAGNOSIS — S0990XA Unspecified injury of head, initial encounter: Secondary | ICD-10-CM

## 2018-10-02 DIAGNOSIS — M533 Sacrococcygeal disorders, not elsewhere classified: Secondary | ICD-10-CM | POA: Insufficient documentation

## 2018-10-02 DIAGNOSIS — Z79899 Other long term (current) drug therapy: Secondary | ICD-10-CM | POA: Insufficient documentation

## 2018-10-02 DIAGNOSIS — Y998 Other external cause status: Secondary | ICD-10-CM | POA: Diagnosis not present

## 2018-10-02 DIAGNOSIS — R0902 Hypoxemia: Secondary | ICD-10-CM | POA: Diagnosis not present

## 2018-10-02 DIAGNOSIS — R001 Bradycardia, unspecified: Secondary | ICD-10-CM | POA: Diagnosis not present

## 2018-10-02 DIAGNOSIS — S3992XA Unspecified injury of lower back, initial encounter: Secondary | ICD-10-CM | POA: Diagnosis not present

## 2018-10-02 DIAGNOSIS — R51 Headache: Secondary | ICD-10-CM | POA: Diagnosis not present

## 2018-10-02 DIAGNOSIS — Y92511 Restaurant or cafe as the place of occurrence of the external cause: Secondary | ICD-10-CM | POA: Insufficient documentation

## 2018-10-02 DIAGNOSIS — R52 Pain, unspecified: Secondary | ICD-10-CM | POA: Diagnosis not present

## 2018-10-02 DIAGNOSIS — S0003XA Contusion of scalp, initial encounter: Secondary | ICD-10-CM | POA: Diagnosis not present

## 2018-10-02 MED ORDER — SODIUM CHLORIDE 0.9 % IV BOLUS
1000.0000 mL | Freq: Once | INTRAVENOUS | Status: AC
  Administered 2018-10-03: 1000 mL via INTRAVENOUS
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
## 2018-10-03 ENCOUNTER — Emergency Department: Payer: Medicaid Other

## 2018-10-03 DIAGNOSIS — S3992XA Unspecified injury of lower back, initial encounter: Secondary | ICD-10-CM | POA: Diagnosis not present

## 2018-10-03 DIAGNOSIS — S0003XA Contusion of scalp, initial encounter: Secondary | ICD-10-CM | POA: Diagnosis not present

## 2018-10-03 DIAGNOSIS — M533 Sacrococcygeal disorders, not elsewhere classified: Secondary | ICD-10-CM | POA: Diagnosis not present

## 2018-10-03 LAB — ETHANOL: Alcohol, Ethyl (B): 199 mg/dL — ABNORMAL HIGH (ref <10)

## 2018-10-03 LAB — CBC WITH DIFFERENTIAL/PLATELET
Abs Immature Granulocytes: 0.03 10*3/uL (ref 0.00–0.07)
Basophils Absolute: 0 10*3/uL (ref 0.0–0.1)
Basophils Relative: 0 %
Eosinophils Absolute: 0.2 10*3/uL (ref 0.0–0.5)
Eosinophils Relative: 2 %
HCT: 37.6 % (ref 36.0–46.0)
HEMOGLOBIN: 12.5 g/dL (ref 12.0–15.0)
Immature Granulocytes: 0 %
Lymphocytes Relative: 26 %
Lymphs Abs: 1.9 10*3/uL (ref 0.7–4.0)
MCH: 29.6 pg (ref 26.0–34.0)
MCHC: 33.2 g/dL (ref 30.0–36.0)
MCV: 88.9 fL (ref 80.0–100.0)
Monocytes Absolute: 0.5 10*3/uL (ref 0.1–1.0)
Monocytes Relative: 6 %
Neutro Abs: 5 10*3/uL (ref 1.7–7.7)
Neutrophils Relative %: 66 %
Platelets: 278 10*3/uL (ref 150–400)
RBC: 4.23 MIL/uL (ref 3.87–5.11)
RDW: 11.8 % (ref 11.5–15.5)
WBC: 7.6 10*3/uL (ref 4.0–10.5)
nRBC: 0 % (ref 0.0–0.2)

## 2018-10-03 LAB — BASIC METABOLIC PANEL
Anion gap: 10 (ref 5–15)
BUN: 14 mg/dL (ref 6–20)
CHLORIDE: 103 mmol/L (ref 98–111)
CO2: 26 mmol/L (ref 22–32)
Calcium: 8.9 mg/dL (ref 8.9–10.3)
Creatinine, Ser: 0.95 mg/dL (ref 0.44–1.00)
GFR calc Af Amer: >60 mL/min (ref >60)
GFR calc non Af Amer: >60 mL/min (ref >60)
Glucose, Bld: 88 mg/dL (ref 70–99)
Potassium: 4.1 mmol/L (ref 3.5–5.1)
SODIUM: 139 mmol/L (ref 135–145)

## 2018-10-03 MED ORDER — IBUPROFEN 600 MG PO TABS: 600.0000 mg | ORAL_TABLET | Freq: Three times a day (TID) | ORAL | 0 refills | Status: DC | PRN

## 2018-10-03 MED ORDER — HYDROCODONE-ACETAMINOPHEN 5-325 MG PO TABS: 1.0000 | ORAL_TABLET | Freq: Four times a day (QID) | ORAL | 0 refills | Status: DC | PRN

## 2018-10-03 MED ORDER — HYDROCODONE-ACETAMINOPHEN 5-325 MG PO TABS
1.0000 | ORAL_TABLET | Freq: Once | ORAL | Status: AC
  Administered 2018-10-03: 1 via ORAL
  Filled 2018-10-03: qty 1

## 2018-10-03 NOTE — Discharge Instructions (Signed)
1.  You may take ibuprofen and Norco as needed for pain. 2.  Apply ice to affected area several times daily. 3.  Return to the ER for worsening symptoms, persistent vomiting, difficulty breathing or other concerns.

## 2018-10-03 NOTE — ED Notes (Signed)
Pt's boyfriend called back and asked to talk to pt, Rn check on pt and reports he is not coming to pick her up, reports she wants to leave, RN checked with Dr. Dolores Frame, pt able to leave has steady gait, and last time she got medication for pain been more than 5 hrs.

## 2018-10-03 NOTE — ED Notes (Signed)
Pt awake, alert and oriented, reviewed discharge instructions, pt will call to find a ride home.  Pt ambulatory to bedside commode with steady gait, no distress noted

## 2018-11-19 ENCOUNTER — Ambulatory Visit (INDEPENDENT_AMBULATORY_CARE_PROVIDER_SITE_OTHER): Payer: Medicaid Other | Admitting: Family Medicine

## 2018-11-19 ENCOUNTER — Encounter: Payer: Self-pay | Admitting: Family Medicine

## 2018-11-19 ENCOUNTER — Other Ambulatory Visit: Payer: Self-pay

## 2018-11-19 ENCOUNTER — Telehealth: Payer: Self-pay | Admitting: Family Medicine

## 2018-11-19 VITALS — Ht 66.0 in | Wt 145.0 lb

## 2018-11-19 DIAGNOSIS — F419 Anxiety disorder, unspecified: Secondary | ICD-10-CM

## 2018-11-19 DIAGNOSIS — M2550 Pain in unspecified joint: Secondary | ICD-10-CM

## 2018-11-19 DIAGNOSIS — F332 Major depressive disorder, recurrent severe without psychotic features: Secondary | ICD-10-CM | POA: Diagnosis not present

## 2018-11-19 MED ORDER — HYDROXYZINE HCL 25 MG PO TABS: 25.0000 mg | ORAL_TABLET | Freq: Three times a day (TID) | ORAL | 1 refills | Status: DC | PRN

## 2018-11-19 MED ORDER — BUSPIRONE HCL 10 MG PO TABS: 10.0000 mg | ORAL_TABLET | Freq: Three times a day (TID) | ORAL | 1 refills | Status: DC
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
## 2018-11-19 NOTE — Telephone Encounter (Signed)
Copied from CRM [id number]. Topic: General - Other >> Nov 19, 2018  1:36 PM [name] A wrote: Reason for CRM: Pt called stating it was urgent for her to have her medications sent in today because they helped her sleep. Pt could not remember the names of the ones she needed refilled. Pt is requesting to be contacted today as soon as the medications are sent in. Please advise.

## 2018-11-19 NOTE — Telephone Encounter (Signed)
Copied from CRM [id number]. Topic: Quick Communication - See Telephone Encounter >> Nov 19, 2018  4:37 PM Jens Som A wrote: CRM for notification. See Telephone encounter for: 11/19/18. Patient is calling because the medication that Beverly Haley was to prescribe for Arthritis not at the parmacy. Please advise

## 2018-11-22 MED ORDER — MELOXICAM 15 MG PO TABS: 15.0000 mg | ORAL_TABLET | Freq: Every day | ORAL | 3 refills | Status: DC | PRN

## 2018-11-22 NOTE — Telephone Encounter (Signed)
Patient calling to check on the high dose arthritis medication being sent to the pharmacy. States that Dr Laural Benes forgot to send that medication after talking with her on 11/19/2018. Please advise.  [facility] - [place], [place] - [address]

## 2018-11-22 NOTE — Telephone Encounter (Signed)
Looks like she saw you on 4/17.

## 2018-11-22 NOTE — Telephone Encounter (Signed)
Rx sent for meloxicam to [facility]

## 2018-11-23 NOTE — Assessment & Plan Note (Signed)
Under excellent control with current regimen, continue present medications and counseling.

## 2018-11-23 NOTE — Assessment & Plan Note (Signed)
Under excellent control, continue current regimen and counseling. Pt aware to establish with Psychiatry if sxs worsen like they were several months ago again and walk in clinics/ER reviewed for any future SI/HI

## 2019-01-25 ENCOUNTER — Other Ambulatory Visit: Payer: Self-pay | Admitting: Family Medicine

## 2019-02-09 ENCOUNTER — Telehealth: Payer: Self-pay

## 2019-02-09 MED ORDER — OMEPRAZOLE 40 MG PO CPDR: 40.0000 mg | DELAYED_RELEASE_CAPSULE | Freq: Every day | ORAL | 3 refills | Status: DC

## 2019-02-09 NOTE — Telephone Encounter (Signed)
Patient came by office requesting Omeprazole for her Acid Reflux.

## 2019-02-09 NOTE — Telephone Encounter (Signed)
Rx sent 

## 2019-02-09 NOTE — Telephone Encounter (Signed)
Patient notified and verbalized standing.

## 2019-03-26 ENCOUNTER — Other Ambulatory Visit: Payer: Self-pay | Admitting: Family Medicine

## 2019-03-26 NOTE — Telephone Encounter (Signed)
Approved per protocol. Requested Prescriptions  Pending Prescriptions Disp Refills  . meloxicam (MOBIC) 15 MG tablet [Pharmacy Med Name: MELOXICAM 15 MG TABLET] 30 tablet 0    Sig: Take 1 tablet (15 mg total) by mouth daily as needed for pain.     Analgesics:  COX2 Inhibitors Passed - 03/26/2019 12:15 PM      Passed - HGB in normal range and within 360 days    Hemoglobin  Date Value Ref Range Status  10/03/2018 12.5 12.0 - 15.0 g/dL Final  11/20/2016 12.9 11.1 - 15.9 g/dL Final         Passed - Cr in normal range and within 360 days    Creatinine  Date Value Ref Range Status  01/28/2013 0.95 0.60 - 1.30 mg/dL Final   Creatinine, Ser  Date Value Ref Range Status  10/03/2018 0.95 0.44 - 1.00 mg/dL Final         Passed - Patient is not pregnant      Passed - Valid encounter within last 12 months    Recent Outpatient Visits          4 months ago Severe episode of recurrent major depressive disorder, without psychotic features ([place])   [place], Rachel [place], [place]   6 months ago [place], [place], [place]   7 months ago Severe episode of recurrent major depressive disorder, without psychotic features [facility])   [place], [place], PA-C   8 months ago Closed fracture of multiple ribs of right side with routine healing, subsequent encounter   [facility] Marnee Guarneri T, NP   9 months ago Right wrist pain   [facility] Valerie Roys, DO      Future Appointments            In 2 months [facility] Desanctis, Lilia Argue, [place], [place]   In 2 months Orene Desanctis, Lilia Argue, [place], [place]

## 2019-04-29 ENCOUNTER — Ambulatory Visit (INDEPENDENT_AMBULATORY_CARE_PROVIDER_SITE_OTHER): Payer: Medicaid Other | Admitting: Family Medicine

## 2019-04-29 ENCOUNTER — Other Ambulatory Visit: Payer: Self-pay

## 2019-04-29 ENCOUNTER — Encounter: Payer: Self-pay | Admitting: Family Medicine

## 2019-04-29 DIAGNOSIS — M25551 Pain in right hip: Secondary | ICD-10-CM | POA: Diagnosis not present

## 2019-04-29 DIAGNOSIS — F332 Major depressive disorder, recurrent severe without psychotic features: Secondary | ICD-10-CM | POA: Diagnosis not present

## 2019-04-29 DIAGNOSIS — F410 Panic disorder [episodic paroxysmal anxiety] without agoraphobia: Secondary | ICD-10-CM

## 2019-04-29 DIAGNOSIS — M25552 Pain in left hip: Secondary | ICD-10-CM

## 2019-04-29 MED ORDER — BUSPIRONE HCL 10 MG PO TABS: 10.0000 mg | ORAL_TABLET | Freq: Three times a day (TID) | ORAL | 5 refills | Status: DC

## 2019-04-29 MED ORDER — MELOXICAM 15 MG PO TABS: 15.0000 mg | ORAL_TABLET | Freq: Every day | ORAL | 5 refills | Status: DC | PRN

## 2019-04-29 MED ORDER — HYDROXYZINE HCL 25 MG PO TABS: 25.0000 mg | ORAL_TABLET | Freq: Three times a day (TID) | ORAL | 5 refills | Status: DC | PRN

## 2019-04-29 NOTE — Assessment & Plan Note (Signed)
Under excellent control on buspar and hydroxyzine regimen. Continue current regimen

## 2019-04-29 NOTE — Assessment & Plan Note (Signed)
Under excellent control on buspar and hydroxyzine regimen. Continue current regimen 
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
## 2019-05-03 ENCOUNTER — Telehealth: Payer: Self-pay | Admitting: Family Medicine

## 2019-05-03 NOTE — Telephone Encounter (Signed)
Copied from [place] [id number]. Topic: Referral - Request for Referral >> May 03, 2019 10:55 AM Leward Quan A wrote: Has patient seen PCP for this complaint? Yes.   *If NO, is insurance requiring patient see PCP for this issue before PCP can refer them? Referral for which specialty: Orthopedic Preferred provider/office: none chosen Reason for referral: Issues with hips

## 2019-05-03 NOTE — Telephone Encounter (Signed)
Called pt and scheduled appt for 05/10/2019.

## 2019-05-03 NOTE — Telephone Encounter (Signed)
She has medicaid- needs to be seen for referral.

## 2019-05-10 ENCOUNTER — Ambulatory Visit: Payer: Medicaid Other | Admitting: Family Medicine

## 2019-05-25 ENCOUNTER — Ambulatory Visit: Payer: Self-pay | Admitting: Family Medicine

## 2019-06-01 ENCOUNTER — Encounter: Payer: Medicaid Other | Admitting: Family Medicine

## 2019-06-01 ENCOUNTER — Telehealth: Payer: Self-pay | Admitting: Family Medicine

## 2019-06-01 DIAGNOSIS — M25552 Pain in left hip: Secondary | ICD-10-CM | POA: Diagnosis not present

## 2019-06-01 DIAGNOSIS — M7061 Trochanteric bursitis, right hip: Secondary | ICD-10-CM | POA: Diagnosis not present

## 2019-06-01 DIAGNOSIS — M7062 Trochanteric bursitis, left hip: Secondary | ICD-10-CM | POA: Diagnosis not present

## 2019-06-01 DIAGNOSIS — M76892 Other specified enthesopathies of left lower limb, excluding foot: Secondary | ICD-10-CM | POA: Diagnosis not present

## 2019-06-01 DIAGNOSIS — M5442 Lumbago with sciatica, left side: Secondary | ICD-10-CM | POA: Diagnosis not present

## 2019-06-01 DIAGNOSIS — G8929 Other chronic pain: Secondary | ICD-10-CM | POA: Diagnosis not present

## 2019-06-01 DIAGNOSIS — M25551 Pain in right hip: Secondary | ICD-10-CM | POA: Diagnosis not present

## 2019-06-01 NOTE — Telephone Encounter (Signed)
Pt called and stated that she would like a call back from the nurse regarding medication busPIRone (BUSPAR) 10 MG tablet [[id number]] and hydrOXYzine (ATARAX/VISTARIL) 25 MG tablet [[id number]]. Pt states that medication is making her gain weight. Please advise

## 2019-06-01 NOTE — Telephone Encounter (Signed)
Called and spoke with patient, she went to ortho today and they gave her injections in her hips to help with the pain, but she is concerned that the medication is causing her to gain weight, would like to discuss options.

## 2019-06-02 ENCOUNTER — Ambulatory Visit (INDEPENDENT_AMBULATORY_CARE_PROVIDER_SITE_OTHER): Payer: Medicaid Other | Admitting: Family Medicine

## 2019-06-02 ENCOUNTER — Encounter: Payer: Self-pay | Admitting: Family Medicine

## 2019-06-02 ENCOUNTER — Other Ambulatory Visit: Payer: Self-pay

## 2019-06-02 ENCOUNTER — Telehealth: Payer: Medicaid Other | Admitting: Family Medicine

## 2019-06-02 DIAGNOSIS — F332 Major depressive disorder, recurrent severe without psychotic features: Secondary | ICD-10-CM | POA: Diagnosis not present

## 2019-06-02 DIAGNOSIS — F410 Panic disorder [episodic paroxysmal anxiety] without agoraphobia: Secondary | ICD-10-CM

## 2019-06-02 DIAGNOSIS — M2550 Pain in unspecified joint: Secondary | ICD-10-CM

## 2019-06-02 DIAGNOSIS — R635 Abnormal weight gain: Secondary | ICD-10-CM

## 2019-06-02 MED ORDER — NALTREXONE-BUPROPION HCL ER 8-90 MG PO TB12: ORAL_TABLET | ORAL | 0 refills | Status: DC

## 2019-06-02 NOTE — Telephone Encounter (Signed)
Needs appointment

## 2019-06-02 NOTE — Telephone Encounter (Signed)
Pt already scheduled for today at 3:30

## 2019-06-02 NOTE — Progress Notes (Signed)
LMP 08/04/2016    Subjective:    [name], female    DOB: Apr 08, 1971, 48 y.o.   MRN: [id number]  HPI: Beverly Haley is a 48 y.o. female  Chief Complaint  Patient presents with   Weight Gain    Patient is concened that the mood meds are causing her to gain weight.      This visit was completed via telephone due to the restrictions of the COVID-19 pandemic. All issues as above were discussed and addressed. Physical exam was done as above through visual confirmation on telephone. If it was felt that the patient should be evaluated in the office, they were directed there. The patient verbally consented to this visit.  Location of the patient: home  Location of the provider: work  Those involved with this call:   Provider: Merrie Roof, PA-C  CMA: Yvonna Alanis, [place] Desk/Registration: Jill Side   Time spent on call: 25 minutes with patient face to face via video conference. More than 50% of this time was spent in counseling and coordination of care. 5 minutes total spent in review of patient's record and preparation of their chart. I verified patient identity using two factors (patient name and date of birth). Patient consents verbally to being seen via telemedicine visit today.   Patient presenting today very distraught about some recent weight gain. Eating healthy and very small amounts and exercises some but has gained about 20-30 lb over the past few months. Now up to 170 lb and having facial fullness that she's never had before. Exercising regularly. Has been trying several weeks of diuretics she got over the counter which haven't helped. Thinks her weight gain initially started with buspar and hydroxyzine but rapidly worsened with the meloxicam being added in recently. Hx of eating disorders, and she is very concerned about falling back into habits from the past. States this weight is causing her significant emotional distress. Recently  remarried, states life otherwise seems really good. Denies SI/HI.   Relevant past medical, surgical, family and social history reviewed and updated as indicated. Interim medical history since our last visit reviewed. Allergies and medications reviewed and updated.  Review of Systems  Per HPI unless specifically indicated above     Objective:    LMP 08/04/2016   Wt Readings from Last 3 Encounters:  06/06/19 170 lb (77.1 kg)  11/19/18 145 lb (65.8 kg)  10/02/18 150 lb (68 kg)    Physical Exam  Unable to perform PE due to patient lack of access to video technology for today's visit.   Results for orders placed or performed during the hospital encounter of 10/02/18  CBC with Differential  Result Value Ref Range   WBC 7.6 4.0 - 10.5 K/uL   RBC 4.23 3.87 - 5.11 MIL/uL   Hemoglobin 12.5 12.0 - 15.0 g/dL   HCT 37.6 36.0 - 46.0 %   MCV 88.9 80.0 - 100.0 fL   MCH 29.6 26.0 - 34.0 pg   MCHC 33.2 30.0 - 36.0 g/dL   RDW 11.8 11.5 - 15.5 %   Platelets 278 150 - 400 K/uL   nRBC 0.0 0.0 - 0.2 %   Neutrophils Relative % 66 %   Neutro Abs 5.0 1.7 - 7.7 K/uL   Lymphocytes Relative 26 %   Lymphs Abs 1.9 0.7 - 4.0 K/uL   Monocytes Relative 6 %   Monocytes Absolute 0.5 0.1 - 1.0 K/uL   Eosinophils Relative 2 %  Eosinophils Absolute 0.2 0.0 - 0.5 K/uL   Basophils Relative 0 %   Basophils Absolute 0.0 0.0 - 0.1 K/uL   Immature Granulocytes 0 %   Abs Immature Granulocytes 0.03 0.00 - 0.07 K/uL  Basic metabolic panel  Result Value Ref Range   Sodium 139 135 - 145 mmol/L   Potassium 4.1 3.5 - 5.1 mmol/L   Chloride 103 98 - 111 mmol/L   CO2 26 22 - 32 mmol/L   Glucose, Bld 88 70 - 99 mg/dL   BUN 14 6 - 20 mg/dL   Creatinine, Ser 1.44 0.44 - 1.00 mg/dL   Calcium 8.9 8.9 - 81.8 mg/dL   GFR calc non Af Amer >60 >60 mL/min   GFR calc Af Amer >60 >60 mL/min   Anion gap 10 5 - 15  Ethanol  Result Value Ref Range   Alcohol, Ethyl (B) 199 (H) <10 mg/dL      Assessment & Plan:    Problem List Items Addressed This Visit      Other   Major depression, recurrent (HCC)    Pt wishes to come off of all of her medicines as she feels they are causing this weight gain she's dealing with. D/c hydroxyzine and monitor for tolerance off of it, will cautiously continue buspar for now as it has made a major impact on her mental health once added. If weight gain persists and she wishes to taper off will counsel on safely doing so       Panic disorder    Pt wishes to come off of all of her medicines as she feels they are causing this weight gain she's dealing with. D/c hydroxyzine and monitor for tolerance off of it, will cautiously continue buspar for now as it has made a major impact on her mental health once added. If weight gain persists and she wishes to taper off will counsel on safely doing so        Other Visit Diagnoses    Weight gain    -  Primary   Possibly medications, d/c hydroxyzine and meloxicam, possibly buspar in future. Diet and exercise discussed. Trial contrave, risks and precautions reviewed   Arthralgia, unspecified joint       D/c meloxicam in case causing weight gain. Working with orthopedics on getting pain under better control. Tylenol and topicals prn       Follow up plan: Return in about 2 weeks (around 06/16/2019) for weight f/u.

## 2019-06-03 ENCOUNTER — Telehealth: Payer: Self-pay | Admitting: Family Medicine

## 2019-06-03 MED ORDER — SAXENDA 18 MG/3ML ~~LOC~~ SOPN: PEN_INJECTOR | SUBCUTANEOUS | 0 refills | Status: AC

## 2019-06-03 MED ORDER — PEN NEEDLES 32G X 6 MM MISC: 1.0000 [IU] | Freq: Every day | 0 refills | Status: DC

## 2019-06-03 NOTE — Telephone Encounter (Signed)
Pt states the  Naltrexone-buPROPion HCl ER 8-90 MG TB12  Is not covered under medicaid. She states she needs another med for weight loss and anti-depressant.  Pt states she does NOT want anything that will cause weight gain. Pt would like called in asap. Hopes to get call back as well. [place], [place] - [place] [phone] (Phone) [phone] (Fax)

## 2019-06-03 NOTE — Telephone Encounter (Signed)
Called pt, discussed seeing if saxenda was covered. She is also interested in keto diet to help drop the weight faster. Discussed some healthy options that are friendly to that diet. She is still interested in phentermine if this medicine is not covered so discussed making an appt with GYN to discuss if she would be a good candidate for this.   She has decided to stay on the buspar but still d/c the meloxicam and hydroxyzine in case these are causing her rapid weight gain. Still recommended reaching out to her counselor asap for emotional support through this time. F/u in 1 month if taking the saxenda

## 2019-06-03 NOTE — Telephone Encounter (Signed)
Routing to provider  

## 2019-06-03 NOTE — Telephone Encounter (Signed)
Pt calling back and states that medication is not covered by medicaid and would like a call back from the office. Please advise   [phone]

## 2019-06-06 ENCOUNTER — Other Ambulatory Visit: Payer: Self-pay | Admitting: Sports Medicine

## 2019-06-06 ENCOUNTER — Telehealth: Payer: Self-pay | Admitting: Emergency Medicine

## 2019-06-06 ENCOUNTER — Emergency Department
Admission: EM | Admit: 2019-06-06 | Discharge: 2019-06-06 | Disposition: A | Discharge status: Home / Self Care | Payer: Medicaid Other | Attending: Emergency Medicine | Admitting: Emergency Medicine

## 2019-06-06 ENCOUNTER — Encounter: Payer: Self-pay | Admitting: Emergency Medicine

## 2019-06-06 ENCOUNTER — Emergency Department: Payer: Medicaid Other

## 2019-06-06 ENCOUNTER — Other Ambulatory Visit: Payer: Self-pay

## 2019-06-06 DIAGNOSIS — Z79899 Other long term (current) drug therapy: Secondary | ICD-10-CM | POA: Insufficient documentation

## 2019-06-06 DIAGNOSIS — M25551 Pain in right hip: Secondary | ICD-10-CM

## 2019-06-06 DIAGNOSIS — R079 Chest pain, unspecified: Secondary | ICD-10-CM | POA: Insufficient documentation

## 2019-06-06 DIAGNOSIS — R519 Headache, unspecified: Secondary | ICD-10-CM | POA: Insufficient documentation

## 2019-06-06 DIAGNOSIS — U071 COVID-19: Secondary | ICD-10-CM | POA: Insufficient documentation

## 2019-06-06 DIAGNOSIS — R109 Unspecified abdominal pain: Secondary | ICD-10-CM | POA: Diagnosis not present

## 2019-06-06 DIAGNOSIS — B349 Viral infection, unspecified: Secondary | ICD-10-CM | POA: Insufficient documentation

## 2019-06-06 DIAGNOSIS — R51 Headache with orthostatic component, not elsewhere classified: Secondary | ICD-10-CM | POA: Diagnosis not present

## 2019-06-06 DIAGNOSIS — R0602 Shortness of breath: Secondary | ICD-10-CM | POA: Diagnosis not present

## 2019-06-06 DIAGNOSIS — G8929 Other chronic pain: Secondary | ICD-10-CM

## 2019-06-06 LAB — COMPREHENSIVE METABOLIC PANEL
ALT: 18 U/L (ref 0–44)
AST: 24 U/L (ref 15–41)
Albumin: 4.3 g/dL (ref 3.5–5.0)
Alkaline Phosphatase: 87 U/L (ref 38–126)
Anion gap: 16 — ABNORMAL HIGH (ref 5–15)
BUN: 19 mg/dL (ref 6–20)
CO2: 23 mmol/L (ref 22–32)
Calcium: 9.6 mg/dL (ref 8.9–10.3)
Chloride: 101 mmol/L (ref 98–111)
Creatinine, Ser: 1.27 mg/dL — ABNORMAL HIGH (ref 0.44–1.00)
GFR calc Af Amer: 58 mL/min — ABNORMAL LOW (ref >60)
GFR calc non Af Amer: 50 mL/min — ABNORMAL LOW (ref >60)
Glucose, Bld: 75 mg/dL (ref 70–99)
Potassium: 3.4 mmol/L — ABNORMAL LOW (ref 3.5–5.1)
Sodium: 140 mmol/L (ref 135–145)
Total Bilirubin: 1.2 mg/dL (ref 0.3–1.2)
Total Protein: 7.4 g/dL (ref 6.5–8.1)

## 2019-06-06 LAB — CBC
HCT: 37.2 % (ref 36.0–46.0)
Hemoglobin: 12.2 g/dL (ref 12.0–15.0)
MCH: 29 pg (ref 26.0–34.0)
MCHC: 32.8 g/dL (ref 30.0–36.0)
MCV: 88.6 fL (ref 80.0–100.0)
Platelets: 326 10*3/uL (ref 150–400)
RBC: 4.2 MIL/uL (ref 3.87–5.11)
RDW: 12.3 % (ref 11.5–15.5)
WBC: 8.6 10*3/uL (ref 4.0–10.5)
nRBC: 0 % (ref 0.0–0.2)

## 2019-06-06 LAB — INFLUENZA PANEL BY PCR (TYPE A & B)
Influenza A By PCR: NEGATIVE
Influenza B By PCR: NEGATIVE

## 2019-06-06 LAB — SARS CORONAVIRUS 2 (TAT 6-24 HRS): SARS Coronavirus 2: POSITIVE — AB

## 2019-06-06 LAB — LIPASE, BLOOD: Lipase: 21 U/L (ref 11–51)

## 2019-06-06 LAB — TROPONIN I (HIGH SENSITIVITY): Troponin I (High Sensitivity): 12 ng/L (ref <18)

## 2019-06-06 MED ORDER — ONDANSETRON HCL 4 MG/2ML IJ SOLN
4.0000 mg | Freq: Once | INTRAMUSCULAR | Status: AC
  Administered 2019-06-06: 05:00:00 4 mg via INTRAVENOUS
  Filled 2019-06-06: qty 2

## 2019-06-06 MED ORDER — SODIUM CHLORIDE 0.9 % IV BOLUS
1000.0000 mL | Freq: Once | INTRAVENOUS | Status: AC
  Administered 2019-06-06: 05:00:00 1000 mL via INTRAVENOUS

## 2019-06-06 MED ORDER — BENZONATATE 100 MG PO CAPS: 100.0000 mg | ORAL_CAPSULE | Freq: Three times a day (TID) | ORAL | 0 refills | Status: AC | PRN

## 2019-06-06 MED ORDER — SODIUM CHLORIDE 0.9 % IV BOLUS
1000.0000 mL | Freq: Once | INTRAVENOUS | Status: AC
  Administered 2019-06-06: 06:00:00 1000 mL via INTRAVENOUS

## 2019-06-06 MED ORDER — KETOROLAC TROMETHAMINE 30 MG/ML IJ SOLN
30.0000 mg | Freq: Once | INTRAMUSCULAR | Status: AC
  Administered 2019-06-06: 30 mg via INTRAVENOUS
  Filled 2019-06-06: qty 1

## 2019-06-06 MED ORDER — AZITHROMYCIN 500 MG PO TABS: 500.0000 mg | ORAL_TABLET | Freq: Every day | ORAL | 0 refills | Status: AC

## 2019-06-06 MED ORDER — ONDANSETRON 4 MG PO TBDP: 4.0000 mg | ORAL_TABLET | Freq: Three times a day (TID) | ORAL | 0 refills | Status: DC | PRN

## 2019-06-06 NOTE — ED Provider Notes (Signed)
[facility] Emergency Department Provider Note   First MD Initiated Contact with Patient 06/06/19 0503     (approximate)  I have reviewed the triage vital signs and the nursing notes.   HISTORY  Chief Complaint Shortness of Breath, Chest Pain, Cough, Abdominal Pain, and Headache   HPI Beverly Haley is a 48 y.o. female with below list of previous medical conditions presents to the emergency department secondary to 2-day history of headache sore throat nonproductive cough dyspnea nausea vomiting diarrhea nasal congestion generalized myalgia.  Patient denies any known sick contact however states that she was in the emergency department and was sitting close to someone that was coughing".  Patient denies any fever afebrile on presentation.  Patient denies any loss of taste or smell.        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   Status post laparoscopic assisted vaginal hysterectomy (LAVH) 06/16/2016   LAVHBSO    Patient Active Problem List   Diagnosis Date Noted   Major depression, recurrent ([place]) 08/13/2018   PTSD (post-traumatic stress disorder) 08/13/2018   Panic disorder 08/13/2018   Rib fractures 07/08/2018   Anxiety 08/30/2017   Raynaud's phenomenon without gangrene 07/08/2017   Surgical menopause 12/08/2016   Endometriosis 12/02/2016    Past Surgical History:  Procedure Laterality Date   APPENDECTOMY     BREAST SURGERY     HYSTEROSCOPY WITH NOVASURE N/A 06/16/2016   Procedure: HYSTEROSCOPY WITH NOVASURE;  Surgeon: Brayton Mars, MD;  Location: [facility] ORS;  Service: Gynecology;  Laterality: N/A;   LAPAROSCOPIC VAGINAL HYSTERECTOMY WITH SALPINGECTOMY N/A 12/08/2016   Procedure: LAPAROSCOPIC ASSISTED VAGINAL HYSTERECTOMY WITH BILATERAL SALPINGECTOMY,  RIGHT OOPHERECTOMY;  Surgeon: Brayton Mars, MD;  Location: [facility] ORS;  Service: Gynecology;  Laterality: N/A;   LAPAROSCOPY N/A 06/16/2016   Procedure: LAPAROSCOPY DIAGNOSTIC WITH BIOPSIES;  Surgeon: Brayton Mars, MD;  Location: [facility] ORS;  Service: Gynecology;  Laterality: N/A;   PLACEMENT OF BREAST IMPLANTS      Prior to Admission medications   Medication Sig Start Date End Date Taking? Authorizing Provider  busPIRone (BUSPAR) 10 MG tablet Take 1 tablet (10 mg total) by mouth 3 (three) times daily. 04/29/19   Volney American, PA-C  hydrOXYzine (ATARAX/VISTARIL) 25 MG tablet Take 1 tablet (25 mg total) by mouth 3 (three) times daily as needed. 04/29/19   Volney American, PA-C  ibuprofen (ADVIL,MOTRIN) 600 MG tablet Take 1 tablet (600 mg total) by mouth every 8 (eight) hours as needed. 10/03/18   Paulette Blanch, MD  Insulin Pen Needle (PEN NEEDLES) 32G X 6 MM MISC 1 Units by Does not apply route daily. 06/03/19   Volney American, PA-C  Liraglutide -Weight Management (SAXENDA) 18 MG/3ML SOPN Inject 0.6 mg into the skin daily for 7 days, THEN 1.2 mg daily for 7 days, THEN 1.8 mg daily for 7 days, THEN 2.4 mg daily for 7 days, THEN 3 mg daily. 06/03/19 09/29/19  Volney American, PA-C  meloxicam (MOBIC) 15 MG tablet Take 1 tablet (15 mg total) by mouth daily as needed for pain. 04/29/19   Volney American, PA-C  Naltrexone-buPROPion HCl ER 8-90 MG TB12 Start 1 tablet every morning for 7 days, then 1 tablet twice daily for  7 days, then 2 tablets every morning and one in the evening for 7 days, then 2 tablets twice daily 06/02/19   Particia Nearing, PA-C  omeprazole (PRILOSEC) 40 MG capsule Take 1 capsule (40 mg total) by mouth daily. 02/09/19   Particia Nearing, PA-C    Allergies Peanut-containing drug products  Family History  Problem Relation Age of Onset   Migraines Mother    Hyperlipidemia Father    Hypertension Father    Breast cancer  Neg Hx    Ovarian cancer Neg Hx    Colon cancer Neg Hx    Diabetes Neg Hx     Social History Social History   Tobacco Use   Smoking status: Never Smoker   Smokeless tobacco: Never Used  Substance Use Topics   Alcohol use: Not Currently   Drug use: No    Review of Systems Constitutional: No fever/chills Eyes: No visual changes. ENT: Positive for sore throat. Cardiovascular: Denies chest pain. Respiratory:  Positive for cough and dyspnea Gastrointestinal: No abdominal pain.  Positive for nausea vomiting and diarrhea Genitourinary: Negative for dysuria. Musculoskeletal: Negative for neck pain.  Negative for back pain. Integumentary: Negative for rash. Neurological: Negative for headaches, focal weakness or numbness.  ____________________________________________   PHYSICAL EXAM:  VITAL SIGNS: ED Triage Vitals  Enc Vitals Group     BP 06/06/19 0055 140/79     Pulse Rate 06/06/19 0055 75     Resp 06/06/19 0055 17     Temp 06/06/19 0055 97.8 F (36.6 C)     Temp Source 06/06/19 0055 Oral     SpO2 06/06/19 0055 99 %     Weight 06/06/19 0057 77.1 kg (170 lb)     Height 06/06/19 0057 1.676 m (5\' 6" )     Head Circumference --      Peak Flow --      Pain Score 06/06/19 0057 8     Pain Loc --      Pain Edu? --      Excl. in GC? --     Constitutional: Alert and oriented.  Eyes: Conjunctivae are normal.  Head: Atraumatic. Mouth/Throat: Patient is wearing a mask. Neck: No stridor.  No meningeal signs.   Cardiovascular: Normal rate, regular rhythm. Good peripheral circulation. Grossly normal heart sounds. Respiratory: Normal respiratory effort.  No retractions. Gastrointestinal: Soft and nontender. No distention.  Musculoskeletal: No lower extremity tenderness nor edema. No gross deformities of extremities. Neurologic:  Normal speech and language. No gross focal neurologic deficits are appreciated.  Skin:  Skin is warm, dry and intact. Psychiatric: Mood and  affect are normal. Speech and behavior are normal.  ____________________________________________   LABS (all labs ordered are listed, but only abnormal results are displayed)  Labs Reviewed  COMPREHENSIVE METABOLIC PANEL - Abnormal; Notable for the following components:      Result Value   Potassium 3.4 (*)    Creatinine, Ser 1.27 (*)    GFR calc non Af Amer 50 (*)    GFR calc Af Amer 58 (*)    Anion gap 16 (*)    All other components within normal limits  SARS CORONAVIRUS 2 (TAT 6-24 HRS)  CBC  LIPASE, BLOOD  INFLUENZA PANEL BY PCR (TYPE A & B)  TROPONIN I (HIGH SENSITIVITY)   ____________________________________________  EKG  ED ECG REPORT I, [place] N Maleeha Halls, the attending physician, personally viewed and interpreted this ECG.   Date: 06/06/2019  EKG Time: 1:22 AM  Rate: 85  Rhythm: Normal sinus rhythm  Axis: Normal  Intervals: Normal  ST&T Change: None  ____________________________________________  RADIOLOGY I, [place] N Berneta Sconyers, personally viewed and evaluated these images (plain radiographs) as part of my medical decision making, as well as reviewing the written report by the radiologist.  ED MD interpretation: No active cardiopulmonary disease noted on chest x-ray per radiologist.  Official radiology report(s): Dg Chest 2 View  Result Date: 06/06/2019 CLINICAL DATA:  Chest pain EXAM: CHEST - 2 VIEW COMPARISON:  July 03, 2018 FINDINGS: The heart size and mediastinal contours are within normal limits. Both lungs are clear. The visualized skeletal structures are unremarkable. IMPRESSION: No active cardiopulmonary disease. Electronically Signed   By: Katherine Mantlehristopher  Green M.D.   On: 06/06/2019 01:35     Procedures   ____________________________________________   INITIAL IMPRESSION / MDM / ASSESSMENT AND PLAN / ED COURSE  As part of my medical decision making, I reviewed the following data within the electronic MEDICAL RECORD NUMBER  48 year old female  presented with above-stated history and physical exam concerning for possible viral infection including COVID-19, pneumonia, bronchitis.  Laboratory data unremarkable with the exception of an elevated creatinine of 1.27.  Patient received 2 L IV normal saline.  Patient also given IV Toradol 30 mg with resolution of headache.  Concern for possible COVID-19 infection and as such I advised the patient to home quarantine while awaiting results.  ____________________________________________  FINAL CLINICAL IMPRESSION(S) / ED DIAGNOSES  Final diagnoses:  Viral illness     MEDICATIONS GIVEN DURING THIS VISIT:  Medications  sodium chloride 0.9 % bolus 1,000 mL (has no administration in time range)  ketorolac (TORADOL) 30 MG/ML injection 30 mg (has no administration in time range)  ondansetron (ZOFRAN) injection 4 mg (has no administration in time range)     ED Discharge Orders    None      *Please note:  Sherrian DiversChristina M Carolan was evaluated in Emergency Department on 06/06/2019 for the symptoms described in the history of present illness. She was evaluated in the context of the global COVID-19 pandemic, which necessitated consideration that the patient might be at risk for infection with the SARS-CoV-2 virus that causes COVID-19. Institutional protocols and algorithms that pertain to the evaluation of patients at risk for COVID-19 are in a state of rapid change based on information released by regulatory bodies including the CDC and federal and state organizations. These policies and algorithms were followed during the patient's care in the ED.  Some ED evaluations and interventions may be delayed as a result of limited staffing during the pandemic.*  Note:  This document was prepared using Dragon voice recognition software and may include unintentional dictation errors.   Darci CurrentBrown, [place] N, MD 06/06/19 [phone]

## 2019-06-06 NOTE — Telephone Encounter (Signed)
Called pt, discussed additional options for prescription weight loss. Patient wanting to see how things go with keto diet and being off the meloxicam and hydroxyzine. She should f/u with GYN if not improving and wanting to explore phentermine further.   She was in ER last night for sore throat, fatigue, and other sick sxs and diagnosed with viral illness with COVID test pending. Will start abx and tessalon today. Discussed to keep [place] informed on how she's feeling and if she needs anything.

## 2019-06-06 NOTE — Telephone Encounter (Signed)
Called patient to inform of positive covid 19 test result.  Left message asking her to call me.

## 2019-06-06 NOTE — ED Triage Notes (Signed)
Pt reports 2 days of headache, sore throat, shortness of breath (pt having no trouble talking in long winded sentences in triage), runny nose, sneezing, aching joints, and chest pain; denies fever;

## 2019-06-08 ENCOUNTER — Other Ambulatory Visit: Payer: Self-pay

## 2019-06-08 ENCOUNTER — Encounter: Payer: Self-pay | Admitting: Family Medicine

## 2019-06-08 ENCOUNTER — Ambulatory Visit (INDEPENDENT_AMBULATORY_CARE_PROVIDER_SITE_OTHER): Payer: Medicaid Other | Admitting: Family Medicine

## 2019-06-08 DIAGNOSIS — U071 COVID-19: Secondary | ICD-10-CM | POA: Diagnosis not present

## 2019-06-08 MED ORDER — ONDANSETRON 4 MG PO TBDP: 4.0000 mg | ORAL_TABLET | Freq: Three times a day (TID) | ORAL | 0 refills | Status: DC | PRN

## 2019-06-08 MED ORDER — ALBUTEROL SULFATE HFA 108 (90 BASE) MCG/ACT IN AERS: 2.0000 | INHALATION_SPRAY | Freq: Four times a day (QID) | RESPIRATORY_TRACT | 0 refills | Status: DC | PRN

## 2019-06-08 NOTE — Assessment & Plan Note (Signed)
Pt wishes to come off of all of her medicines as she feels they are causing this weight gain she's dealing with. D/c hydroxyzine and monitor for tolerance off of it, will cautiously continue buspar for now as it has made a major impact on her mental health once added. If weight gain persists and she wishes to taper off will counsel on safely doing so

## 2019-06-08 NOTE — Progress Notes (Signed)
LMP 08/04/2016    Subjective:    [name], female    DOB: 31-Dec-1970, 48 y.o.   MRN: [id number]  HPI: Beverly Haley is a 48 y.o. female  Chief Complaint  Patient presents with  . COVID    covid positive on 06/06/19    Beverly Haley went to [facility] ER on 06/06/19 with SOB and abdominal pain. She was diagnosed with COVID-19 and has been home quarantining since then. She notes that she has not been feeling well. She states that she has been having vomiting, abdominal pain, coughing, sneezing, chills, diarrhea. She is also noticing that she is +SOB- can walk from her bed to the bathroom. She cannot really describe the SOB, but denies that it is particularly severe- it seems to be just different from what she usually has. She is otherwise doing OK. No other concerns.   Relevant past medical, surgical, family and social history reviewed and updated as indicated. Interim medical history since our last visit reviewed. Allergies and medications reviewed and updated.  Review of Systems  Constitutional: Positive for chills, diaphoresis, fatigue and fever. Negative for activity change, appetite change and unexpected weight change.  HENT: Positive for congestion, postnasal drip, rhinorrhea and sneezing. Negative for dental problem, drooling, ear discharge, ear pain, facial swelling, hearing loss, mouth sores, nosebleeds, sinus pressure, sinus pain, sore throat, tinnitus, trouble swallowing and voice change.   Eyes: Negative.   Respiratory: Positive for cough and shortness of breath. Negative for apnea, choking, chest tightness, wheezing and stridor.   Cardiovascular: Negative.   Gastrointestinal: Positive for abdominal pain, diarrhea, nausea and vomiting. Negative for abdominal distention, anal bleeding, blood in stool, constipation and rectal pain.  Musculoskeletal: Positive for myalgias. Negative for arthralgias, back pain, gait problem, joint swelling, neck pain and neck  stiffness.  Skin: Negative.   Neurological: Negative.   Psychiatric/Behavioral: Negative.     Per HPI unless specifically indicated above     Objective:    LMP 08/04/2016   Wt Readings from Last 3 Encounters:  06/06/19 170 lb (77.1 kg)  11/19/18 145 lb (65.8 kg)  10/02/18 150 lb (68 kg)    Physical Exam Vitals signs and nursing note reviewed.  Constitutional:      Appearance: She is ill-appearing.  Pulmonary:     Effort: Pulmonary effort is normal. No respiratory distress.     Comments: Speaking in full sentences Neurological:     Mental Status: She is alert.  Psychiatric:        Mood and Affect: Mood normal.        Behavior: Behavior normal.        Thought Content: Thought content normal.        Judgment: Judgment normal.     Results for orders placed or performed during the hospital encounter of 06/06/19  SARS CORONAVIRUS 2 (TAT 6-24 HRS) Nasopharyngeal   Specimen: Nasopharyngeal  Result Value Ref Range   SARS Coronavirus 2 POSITIVE (A) NEGATIVE  CBC  Result Value Ref Range   WBC 8.6 4.0 - 10.5 K/uL   RBC 4.20 3.87 - 5.11 MIL/uL   Hemoglobin 12.2 12.0 - 15.0 g/dL   HCT 28.0 03.4 - 91.7 %   MCV 88.6 80.0 - 100.0 fL   MCH 29.0 26.0 - 34.0 pg   MCHC 32.8 30.0 - 36.0 g/dL   RDW 91.5 05.6 - 97.9 %   Platelets 326 150 - 400 K/uL   nRBC 0.0 0.0 - 0.2 %  Comprehensive metabolic panel  Result Value Ref Range   Sodium 140 135 - 145 mmol/L   Potassium 3.4 (L) 3.5 - 5.1 mmol/L   Chloride 101 98 - 111 mmol/L   CO2 23 22 - 32 mmol/L   Glucose, Bld 75 70 - 99 mg/dL   BUN 19 6 - 20 mg/dL   Creatinine, Ser 1.27 (H) 0.44 - 1.00 mg/dL   Calcium 9.6 8.9 - 10.3 mg/dL   Total Protein 7.4 6.5 - 8.1 g/dL   Albumin 4.3 3.5 - 5.0 g/dL   AST 24 15 - 41 U/L   ALT 18 0 - 44 U/L   Alkaline Phosphatase 87 38 - 126 U/L   Total Bilirubin 1.2 0.3 - 1.2 mg/dL   GFR calc non Af Amer 50 (L) >60 mL/min   GFR calc Af Amer 58 (L) >60 mL/min   Anion gap 16 (H) 5 - 15  Lipase, blood   Result Value Ref Range   Lipase 21 11 - 51 U/L  Influenza panel by PCR (type A & B)  Result Value Ref Range   Influenza A By PCR NEGATIVE NEGATIVE   Influenza B By PCR NEGATIVE NEGATIVE  Troponin I (High Sensitivity)  Result Value Ref Range   Troponin I (High Sensitivity) 12 <18 ng/L      Assessment & Plan:   Problem List Items Addressed This Visit    None    Visit Diagnoses    COVID-19    -  Primary   Will treat symptomatically with albuterol and zofran. Continue to monitor. Call with any concerns. Warning signs to go to ER discussed.    Relevant Orders   Temperature monitoring       Follow up plan: Return if symptoms worsen or fail to improve.   . This visit was completed via telephone due to the restrictions of the COVID-19 pandemic. All issues as above were discussed and addressed but no physical exam was performed. If it was felt that the patient should be evaluated in the office, they were directed there. The patient verbally consented to this visit. Patient was unable to complete an audio/visual visit due to Lack of equipment. Due to the catastrophic nature of the COVID-19 pandemic, this visit was done through audio contact only. . Location of the patient: home . Location of the provider: home . Those involved with this call:  . Provider: Park Liter, DO . CMA: Yvonna Alanis, [place] . Front Desk/Registration: Don Perking  . Time spent on call: 21 minutes on the phone discussing health concerns. 23 minutes total spent in review of patient's record and preparation of their chart.

## 2019-06-08 NOTE — Assessment & Plan Note (Signed)
Pt wishes to come off of all of her medicines as she feels they are causing this weight gain she's dealing with. D/c hydroxyzine and monitor for tolerance off of it, will cautiously continue buspar for now as it has made a major impact on her mental health once added. If weight gain persists and she wishes to taper off will counsel on safely doing so  

## 2019-06-13 ENCOUNTER — Ambulatory Visit: Admission: RE | Admit: 2019-06-13 | Payer: Medicaid Other | Source: Ambulatory Visit

## 2019-06-21 ENCOUNTER — Ambulatory Visit: Payer: Medicaid Other | Attending: Physical Therapy | Admitting: Physical Therapy

## 2019-10-04 ENCOUNTER — Other Ambulatory Visit: Payer: Self-pay | Admitting: Family Medicine

## 2019-11-07 ENCOUNTER — Other Ambulatory Visit: Payer: Self-pay

## 2019-11-08 MED ORDER — OMEPRAZOLE 40 MG PO CPDR: 40.0000 mg | DELAYED_RELEASE_CAPSULE | Freq: Every day | ORAL | 0 refills | Status: DC

## 2019-11-23 ENCOUNTER — Other Ambulatory Visit: Payer: Self-pay | Admitting: Family Medicine

## 2019-12-28 ENCOUNTER — Telehealth: Payer: Self-pay | Admitting: Family Medicine

## 2019-12-28 DIAGNOSIS — F4310 Post-traumatic stress disorder, unspecified: Secondary | ICD-10-CM

## 2019-12-28 DIAGNOSIS — F332 Major depressive disorder, recurrent severe without psychotic features: Secondary | ICD-10-CM

## 2019-12-28 DIAGNOSIS — F419 Anxiety disorder, unspecified: Secondary | ICD-10-CM

## 2019-12-28 MED ORDER — BUSPIRONE HCL 10 MG PO TABS: 10.0000 mg | ORAL_TABLET | Freq: Three times a day (TID) | ORAL | 2 refills | Status: DC

## 2019-12-28 NOTE — Telephone Encounter (Signed)
Pt called, wanting to get refill on buspar and referral to [facility] where she is scheduled to start seeing provider and doing counseling.

## 2020-01-23 ENCOUNTER — Telehealth: Payer: Self-pay | Admitting: Family Medicine

## 2020-01-23 NOTE — Telephone Encounter (Signed)
Copied from CRM [id number]. Topic: General - Other >> Jan 23, 2020  8:49 AM Tamela Oddi wrote: Reason for CRM: Patient called to ask if the doctor or nurse would prescribe a cough medication for her since she has covid now and has developed a cough.  She stated that before when she was sick the doctor gave her a little bill that helped her cough.  Please advise and call patient at [phone]

## 2020-01-23 NOTE — Telephone Encounter (Signed)
Would need appt for cough medicine, since she has covid, would have to be virtual appt

## 2020-01-23 NOTE — Telephone Encounter (Signed)
LVM for pt to call back to schedule an appt.  

## 2020-01-24 NOTE — Telephone Encounter (Signed)
Pt stated she was not interested in making a apt at this time.

## 2020-02-20 ENCOUNTER — Other Ambulatory Visit: Payer: Self-pay | Admitting: Family Medicine

## 2020-03-05 ENCOUNTER — Ambulatory Visit (INDEPENDENT_AMBULATORY_CARE_PROVIDER_SITE_OTHER): Payer: Medicaid Other | Admitting: Nurse Practitioner

## 2020-03-05 ENCOUNTER — Encounter: Payer: Self-pay | Admitting: Nurse Practitioner

## 2020-03-05 DIAGNOSIS — G43909 Migraine, unspecified, not intractable, without status migrainosus: Secondary | ICD-10-CM | POA: Insufficient documentation

## 2020-03-05 DIAGNOSIS — G43911 Migraine, unspecified, intractable, with status migrainosus: Secondary | ICD-10-CM

## 2020-03-05 MED ORDER — CYCLOBENZAPRINE HCL 10 MG PO TABS: 10.0000 mg | ORAL_TABLET | Freq: Three times a day (TID) | ORAL | 0 refills | Status: DC | PRN

## 2020-03-05 NOTE — Assessment & Plan Note (Addendum)
Chronic, seem to be worsening.  Given worsening pain with ongoing visual changes and photophobia/nausea will order MRI of head.  In meantime, will try muscle relaxant to provide pain relief.  If not better in 1-2 weeks, return to clinic.

## 2020-03-05 NOTE — Patient Instructions (Signed)
Magnetic Resonance Imaging Magnetic resonance imaging (MRI) is a painless test that takes pictures of the inside of your body. This test uses a strong magnet. This test does not use X-rays. What happens before the procedure?  You will be asked about any metal you may have in your body. This includes: ? Any joint replacement (prosthesis), such as an artificial knee or hip. ? Any implanted devices or ports. ? A metallic ear implant (cochlear implant). ? An artificial heart valve. ? A metallic object in the eye. ? Metal splinters. ? Bullet fragments. ? Any tattoos. Some of the darker inks can cause problems with testing.  You will be asked to take off all metal. This includes: ? Your watch, jewelry, and other metal objects. ? Hearing aids. ? Dentures. ? Underwire bra. ? Makeup. Some kinds of makeup contain small amounts of metal. ? Braces and fillings normally are not a problem.  If you are breastfeeding, ask your doctor if you need to pump before your test and then stop breastfeeding for a short time. You may need to do this if dye (contrast material) will be used during your MRI.  If you have a fear of cramped spaces (claustrophobia), you may be given medicines prior to the MRI. What happens during the procedure?   You may be given earplugs or headphones to listen to music. The MRI machine can be noisy.  You will lie down on a platform. It looks like a long table.  If dye will be used, an IV tube will be placed into one of your veins. Dye will be given through your IV tube at a certain time as images are taken.  The platform will slide into a tunnel. The tunnel has magnets inside of it. When you are inside the tunnel, you will still be able to talk to your doctor.  The tunnel will scan your body and make images. You will be asked to lie very still. Your doctor will tell you when you can move. You may have to wait a few minutes to make sure that the images from the test are  clear.  When all images are taken, the platform will slide out of the tunnel. The procedure may vary among doctors and hospitals. What happens after the procedure?  You may be taken to a recovery area if sedation medicines were used. Your blood pressure, heart rate, breathing rate, and blood oxygen level will be monitored until you leave the hospital or clinic.  If dye was used: ? It will leave your body through your pee (urine). It takes about a day for all of the dye to leave the body. ? You may be told to drink plenty of fluids. This helps your body get rid of the dye. ? If you are breastfeeding, do not breastfeed your child until your doctor says that this is safe.  You may go back to your normal activities right away, or as told by your doctor.  It is up to you to get your test results. Ask your doctor, or the department that is doing the test, when your results will be ready. Summary  Magnetic resonance imaging (MRI) is a test that takes pictures of the inside of your body.  Before your MRI, be sure to tell your doctor about any metal you may have in your body.  You may go back to your normal activities right away, or as told by your doctor. This information is not intended to replace advice   given to you by your health care provider. Make sure you discuss any questions you have with your health care provider. Document Revised: 06/15/2017 Document Reviewed: 06/15/2017 Elsevier Patient Education  2020 [employer]  
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
## 2020-04-07 ENCOUNTER — Other Ambulatory Visit: Payer: Self-pay | Admitting: Family Medicine

## 2020-05-28 ENCOUNTER — Other Ambulatory Visit: Payer: Self-pay | Admitting: Family Medicine

## 2020-08-07 ENCOUNTER — Other Ambulatory Visit: Payer: Self-pay | Admitting: Family Medicine

## 2020-09-05 ENCOUNTER — Other Ambulatory Visit: Payer: Self-pay | Admitting: Family Medicine

## 2020-09-06 ENCOUNTER — Other Ambulatory Visit: Payer: Self-pay | Admitting: Family Medicine

## 2020-10-09 ENCOUNTER — Other Ambulatory Visit: Payer: Self-pay | Admitting: Family Medicine

## 2020-10-09 ENCOUNTER — Encounter: Payer: Self-pay | Admitting: Family Medicine

## 2020-10-09 NOTE — Telephone Encounter (Signed)
FYI

## 2020-10-09 NOTE — Telephone Encounter (Signed)
Number not in service, sent mychart letter.

## 2020-10-09 NOTE — Telephone Encounter (Signed)
Medication Refill - Medication: omeprazole (PRILOSEC) 40 MG capsule     Preferred Pharmacy (with phone number or street name):  [facility] - [place], [place] - [address] Phone:  [phone]  Fax:  [phone]       Agent: Please be advised that RX refills may take up to 3 business days. We ask that you follow-up with your pharmacy.

## 2020-10-09 NOTE — Telephone Encounter (Signed)
Refill request for Omeprazole, courtesy refill 09/06/20; previous message sent via MyChart on 09/06/20 for pt to schedule appt;  attempted to contact pt x 2 to schedule appt; message on cell phone states number is "not in service"; refill refused

## 2020-10-11 ENCOUNTER — Encounter: Payer: Self-pay | Admitting: Nurse Practitioner

## 2020-10-11 ENCOUNTER — Ambulatory Visit (INDEPENDENT_AMBULATORY_CARE_PROVIDER_SITE_OTHER): Payer: Medicaid Other | Admitting: Nurse Practitioner

## 2020-10-11 DIAGNOSIS — K219 Gastro-esophageal reflux disease without esophagitis: Secondary | ICD-10-CM | POA: Diagnosis not present

## 2020-10-11 MED ORDER — OMEPRAZOLE 40 MG PO CPDR: 40.0000 mg | DELAYED_RELEASE_CAPSULE | Freq: Every day | ORAL | 0 refills | Status: DC

## 2020-10-11 NOTE — Patient Instructions (Signed)
Gastroesophageal Reflux Disease, Adult  Gastroesophageal reflux (GER) happens when acid from the stomach flows up into the tube that connects the mouth and the stomach (esophagus). Normally, food travels down the esophagus and stays in the stomach to be digested. With GER, food and stomach acid sometimes move back up into the esophagus. You may have a disease called gastroesophageal reflux disease (GERD) if the reflux:  Happens often.  Causes frequent or very bad symptoms.  Causes problems such as damage to the esophagus. When this happens, the esophagus becomes sore and swollen. Over time, GERD can make small holes (ulcers) in the lining of the esophagus. What are the causes? This condition is caused by a problem with the muscle between the esophagus and the stomach. When this muscle is weak or not normal, it does not close properly to keep food and acid from coming back up from the stomach. The muscle can be weak because of:  Tobacco use.  Pregnancy.  Having a certain type of hernia (hiatal hernia).  Alcohol use.  Certain foods and drinks, such as coffee, chocolate, onions, and peppermint. What increases the risk?  Being overweight.  Having a disease that affects your connective tissue.  Taking NSAIDs, such a ibuprofen. What are the signs or symptoms?  Heartburn.  Difficult or painful swallowing.  The feeling of having a lump in the throat.  A bitter taste in the mouth.  Bad breath.  Having a lot of saliva.  Having an upset or bloated stomach.  Burping.  Chest pain. Different conditions can cause chest pain. Make sure you see your doctor if you have chest pain.  Shortness of breath or wheezing.  A long-term cough or a cough at night.  Wearing away of the surface of teeth (tooth enamel).  Weight loss. How is this treated?  Making changes to your diet.  Taking medicine.  Having surgery. Treatment will depend on how bad your symptoms are. Follow these  instructions at home: Eating and drinking  Follow a diet as told by your doctor. You may need to avoid foods and drinks such as: ? Coffee and tea, with or without caffeine. ? Drinks that contain alcohol. ? Energy drinks and sports drinks. ? Bubbly (carbonated) drinks or sodas. ? Chocolate and cocoa. ? Peppermint and mint flavorings. ? Garlic and onions. ? Horseradish. ? Spicy and acidic foods. These include peppers, chili powder, curry powder, vinegar, hot sauces, and BBQ sauce. ? Citrus fruit juices and citrus fruits, such as oranges, lemons, and limes. ? Tomato-based foods. These include red sauce, chili, salsa, and pizza with red sauce. ? Fried and fatty foods. These include donuts, french fries, potato chips, and high-fat dressings. ? High-fat meats. These include hot dogs, rib eye steak, sausage, ham, and bacon. ? High-fat dairy items, such as whole milk, butter, and cream cheese.  Eat small meals often. Avoid eating large meals.  Avoid drinking large amounts of liquid with your meals.  Avoid eating meals during the 2-3 hours before bedtime.  Avoid lying down right after you eat.  Do not exercise right after you eat.   Lifestyle  Do not smoke or use any products that contain nicotine or tobacco. If you need help quitting, ask your doctor.  Try to lower your stress. If you need help doing this, ask your doctor.  If you are overweight, lose an amount of weight that is healthy for you. Ask your doctor about a safe weight loss goal.   General instructions    Pay attention to any changes in your symptoms.  Take over-the-counter and prescription medicines only as told by your doctor.  Do not take aspirin, ibuprofen, or other NSAIDs unless your doctor says it is okay.  Wear loose clothes. Do not wear anything tight around your waist.  Raise (elevate) the head of your bed about 6 inches (15 cm). You may need to use a wedge to do this.  Avoid bending over if this makes your  symptoms worse.  Keep all follow-up visits. Contact a doctor if:  You have new symptoms.  You lose weight and you do not know why.  You have trouble swallowing or it hurts to swallow.  You have wheezing or a cough that keeps happening.  You have a hoarse voice.  Your symptoms do not get better with treatment. Get help right away if:  You have sudden pain in your arms, neck, jaw, teeth, or back.  You suddenly feel sweaty, dizzy, or light-headed.  You have chest pain or shortness of breath.  You vomit and the vomit is green, yellow, or black, or it looks like blood or coffee grounds.  You faint.  Your poop (stool) is red, bloody, or black.  You cannot swallow, drink, or eat. These symptoms may represent a serious problem that is an emergency. Do not wait to see if the symptoms will go away. Get medical help right away. Call your local emergency services (911 in the [place] Do not drive yourself to the hospital. Summary  If a person has gastroesophageal reflux disease (GERD), food and stomach acid move back up into the esophagus and cause symptoms or problems such as damage to the esophagus.  Treatment will depend on how bad your symptoms are.  Follow a diet as told by your doctor.  Take all medicines only as told by your doctor. This information is not intended to replace advice given to you by your health care provider. Make sure you discuss any questions you have with your health care provider. Document Revised: 01/30/2020 Document Reviewed: 01/30/2020 Elsevier Patient Education  2021 [employer] Inc.  

## 2020-10-11 NOTE — Assessment & Plan Note (Addendum)
Chronic, stable. Controlled with omeprazole 40mg  daily. Refill sent to pharmacy for 30 day supply. Education on diet for control as well. Need to follow-up in person within next 30 days for physical and labwork.

## 2020-10-11 NOTE — Progress Notes (Signed)
Established Patient Office Visit  Subjective:  [name], female    DOB: 1971/07/21  Age: 50 y.o. MRN: [id number]  CC:  Chief Complaint  Patient presents with  . Gastroesophageal Reflux    Pt requesting refill on Omeprazole    HPI Beverly Haley presents for follow-up with GERD  GERD  GERD control status: controlledSatisfied with current treatment? yes Heartburn frequency: every other day if she doesn't take her medicine, only this medication helps that she has tried Medication side effects: no  Medication compliance: stable Previous GERD medications: omeprazole 40 daily Antacid use frequency: not taking Dysphagia: no Odynophagia:  no Hematemesis: no Blood in stool: no EGD: no   Past Medical History:  Diagnosis Date  . Bulky or enlarged uterus 05/15/2016  . Hip pain   . Hyperlipidemia   . Migraine   . 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 Status post laparoscopic assisted vaginal hysterectomy (LAVH) 06/16/2016   LAVHBSO    Past Surgical History:  Procedure Laterality Date  . APPENDECTOMY    . BREAST SURGERY    . HYSTEROSCOPY WITH NOVASURE N/A 06/16/2016   Procedure: HYSTEROSCOPY WITH NOVASURE;  Surgeon: Herold Harms, MD;  Location: [facility];  Service: Gynecology;  Laterality: N/A;  . LAPAROSCOPIC VAGINAL HYSTERECTOMY WITH SALPINGECTOMY N/A 12/08/2016   Procedure: LAPAROSCOPIC ASSISTED VAGINAL HYSTERECTOMY WITH BILATERAL SALPINGECTOMY, RIGHT OOPHERECTOMY;  Surgeon: Herold Harms, MD;  Location: [facility];  Service: Gynecology;  Laterality: N/A;  . LAPAROSCOPY N/A 06/16/2016   Procedure: LAPAROSCOPY DIAGNOSTIC WITH BIOPSIES;  Surgeon: Herold Harms, MD;  Location: [facility];  Service: Gynecology;  Laterality: N/A;  . PLACEMENT OF BREAST IMPLANTS      Family History  Problem Relation Age of Onset  . Migraines Mother   . Hyperlipidemia Father   . Hypertension Father    . Breast cancer Neg Hx   . Ovarian cancer Neg Hx   . Colon cancer Neg Hx   . Diabetes Neg Hx     Social History   Socioeconomic History  . Marital status: Single    Spouse name: Not on file  . Number of children: Not on file  . Years of education: Not on file  . Highest education level: Not on file  Occupational History  . Not on file  Tobacco Use  . Smoking status: Never Smoker  . Smokeless tobacco: Never Used  Vaping Use  . Vaping Use: Never used  Substance and Sexual Activity  . Alcohol use: Yes    Comment: socially  . Drug use: No  . Sexual activity: Yes    Birth control/protection: None, Surgical    Comment: Spouse had vasectomy  Other Topics Concern  . Not on file  Social History Narrative  . Not on file   Social Determinants of Health   Financial Resource Strain: Not on file  Food Insecurity: Not on file  Transportation Needs: Not on file  Physical Activity: Not on file  Stress: Not on file  Social Connections: Not on file  Intimate Partner Violence: Not on file    Outpatient Medications Prior to Visit  Medication Sig Dispense Refill  . albuterol (VENTOLIN HFA) 108 (90 Base) MCG/ACT inhaler Inhale 2 puffs into the lungs every 6 (six) hours as needed for wheezing or shortness of breath. 18 g 0  . omeprazole (PRILOSEC) 40 MG capsule Take 1 capsule (40 mg total) by  mouth daily. 10 capsule 0  . busPIRone (BUSPAR) 10 MG tablet Take 1 tablet (10 mg total) by mouth 3 (three) times daily. (Patient not taking: Reported on 10/11/2020) 90 tablet 2  . cyclobenzaprine (FLEXERIL) 10 MG tablet Take 1 tablet (10 mg total) by mouth 3 (three) times daily as needed for muscle spasms. (Patient not taking: Reported on 10/11/2020) 30 tablet 0  . ibuprofen (ADVIL,MOTRIN) 600 MG tablet Take 1 tablet (600 mg total) by mouth every 8 (eight) hours as needed. 15 tablet 0  . Insulin Pen Needle (PEN NEEDLES) 32G X 6 MM MISC 1 Units by Does not apply route daily. (Patient not taking:  Reported on 06/08/2019) 30 each 0  . Naltrexone-buPROPion HCl ER 8-90 MG TB12 Start 1 tablet every morning for 7 days, then 1 tablet twice daily for 7 days, then 2 tablets every morning and one in the evening for 7 days, then 2 tablets twice daily (Patient not taking: Reported on 06/08/2019) 70 tablet 0  . ondansetron (ZOFRAN ODT) 4 MG disintegrating tablet Take 1 tablet (4 mg total) by mouth every 8 (eight) hours as needed for nausea or vomiting. 20 tablet 0   No facility-administered medications prior to visit.    Allergies  Allergen Reactions  . Peanut-Containing Drug Products Other (See Comments)    "severe headache"    ROS Review of Systems  Constitutional: Negative for fatigue and unexpected weight change.  Respiratory: Negative.   Cardiovascular: Negative.   Gastrointestinal: Positive for abdominal pain (reflux, stable on omeprazole). Negative for blood in stool, constipation and vomiting.      Objective:    Physical Exam Vitals and nursing note reviewed.  Pulmonary:     Effort: Pulmonary effort is normal.     Comments: Able to talk in complete sentences Neurological:     Mental Status: She is oriented to person, place, and time.     LMP 08/04/2016  Wt Readings from Last 3 Encounters:  06/06/19 170 lb (77.1 kg)  11/19/18 145 lb (65.8 kg)  10/02/18 150 lb (68 kg)     Health Maintenance Due  Topic Date Due  . Hepatitis C Screening  Never done  . PAP SMEAR-Modifier  10/29/2015  . COLONOSCOPY (Pts 45-8yrs Insurance coverage will need to be confirmed)  Never done    There are no preventive care reminders to display for this patient.  Lab Results  Component Value Date   TSH 0.715 11/18/2016   Lab Results  Component Value Date   WBC 8.6 06/06/2019   HGB 12.2 06/06/2019   HCT 37.2 06/06/2019   MCV 88.6 06/06/2019   PLT 326 06/06/2019   Lab Results  Component Value Date   NA 140 06/06/2019   K 3.4 (L) 06/06/2019   CO2 23 06/06/2019   GLUCOSE 75  06/06/2019   BUN 19 06/06/2019   CREATININE 1.27 (H) 06/06/2019   BILITOT 1.2 06/06/2019   ALKPHOS 87 06/06/2019   AST 24 06/06/2019   ALT 18 06/06/2019   PROT 7.4 06/06/2019   ALBUMIN 4.3 06/06/2019   CALCIUM 9.6 06/06/2019   ANIONGAP 16 (H) 06/06/2019    Lab Results  Component Value Date   HGBA1C 4.8 11/18/2016      Assessment & Plan:   Problem List Items Addressed This Visit      Digestive   Gastroesophageal reflux disease - Primary    Chronic, stable. Controlled with omeprazole 40mg  daily. Refill sent to pharmacy for 30 day supply. Education on diet  for control as well. Need to follow-up in person within next 30 days for physical and labwork.       Relevant Medications   omeprazole (PRILOSEC) 40 MG capsule      Meds ordered this encounter  Medications  . omeprazole (PRILOSEC) 40 MG capsule    Sig: Take 1 capsule (40 mg total) by mouth daily.    Dispense:  30 capsule    Refill:  0    Appointment needed    Follow-up: Return in about 1 month (around 11/11/2020) for within next 30 days physical in office.    . This visit was completed via telephone due to the restrictions of the COVID-19 pandemic. All issues as above were discussed and addressed but no physical exam was performed. If it was felt that the patient should be evaluated in the office, they were directed there. The patient verbally consented to this visit. Patient was unable to complete an audio/visual visit due to Lack of equipment. Due to the catastrophic nature of the COVID-19 pandemic, this visit was done through audio contact only. . Location of the patient: home . Location of the provider: work . Those involved with this call:  . Provider: Alene Mires, DNP . CMA: Wilhemena Durie, CMA . Front Desk/Registration: Harriet Pho  . Time spent on call: 11 minutes on the phone discussing health concerns. 20 minutes total spent in review of patient's record and preparation of their chart.    Gerre Scull, NP

## 2020-10-15 ENCOUNTER — Telehealth: Payer: Self-pay

## 2020-10-15 NOTE — Telephone Encounter (Signed)
Unable to lvm to make apt °

## 2020-10-15 NOTE — Telephone Encounter (Signed)
-----   Message from Gerre Scull, NP sent at 10/11/2020  9:49 AM EST ----- She needs an in office visit before 30 days for physical. Can be we with me.

## 2020-11-05 ENCOUNTER — Telehealth: Payer: Self-pay

## 2020-11-05 ENCOUNTER — Encounter: Payer: Self-pay | Admitting: Family Medicine

## 2020-11-05 ENCOUNTER — Other Ambulatory Visit: Payer: Self-pay

## 2020-11-05 ENCOUNTER — Ambulatory Visit (INDEPENDENT_AMBULATORY_CARE_PROVIDER_SITE_OTHER): Payer: Medicaid Other | Admitting: Family Medicine

## 2020-11-05 VITALS — BP 121/84 | HR 69 | Temp 98.2°F | Ht 64.0 in | Wt 217.0 lb

## 2020-11-05 DIAGNOSIS — H5213 Myopia, bilateral: Secondary | ICD-10-CM

## 2020-11-05 DIAGNOSIS — Z0000 Encounter for general adult medical examination without abnormal findings: Secondary | ICD-10-CM | POA: Diagnosis not present

## 2020-11-05 DIAGNOSIS — Z1231 Encounter for screening mammogram for malignant neoplasm of breast: Secondary | ICD-10-CM | POA: Diagnosis not present

## 2020-11-05 DIAGNOSIS — Z1211 Encounter for screening for malignant neoplasm of colon: Secondary | ICD-10-CM

## 2020-11-05 MED ORDER — OMEPRAZOLE 40 MG PO CPDR: 40.0000 mg | DELAYED_RELEASE_CAPSULE | Freq: Every day | ORAL | 3 refills | Status: DC

## 2020-11-05 MED ORDER — LEVOCETIRIZINE DIHYDROCHLORIDE 5 MG PO TABS: 5.0000 mg | ORAL_TABLET | Freq: Every evening | ORAL | 3 refills | Status: DC

## 2020-11-05 NOTE — Telephone Encounter (Signed)
Xyzal sent to her pharmacy at her appointment.

## 2020-11-05 NOTE — Patient Instructions (Addendum)
Call to schedule your Mammogram:  [facility]  Address: [address], [place], [place] [address]  Phone: [phone]   Health Maintenance, Female Adopting a healthy lifestyle and getting preventive care are important in promoting health and wellness. Ask your health care provider about:  The right schedule for you to have regular tests and exams.  Things you can do on your own to prevent diseases and keep yourself healthy. What should I know about diet, weight, and exercise? Eat a healthy diet  Eat a diet that includes plenty of vegetables, fruits, low-fat dairy products, and lean protein.  Do not eat a lot of foods that are high in solid fats, added sugars, or sodium.   Maintain a healthy weight Body mass index (BMI) is used to identify weight problems. It estimates body fat based on height and weight. Your health care provider can help determine your BMI and help you achieve or maintain a healthy weight. Get regular exercise Get regular exercise. This is one of the most important things you can do for your health. Most adults should:  Exercise for at least 150 minutes each week. The exercise should increase your heart rate and make you sweat (moderate-intensity exercise).  Do strengthening exercises at least twice a week. This is in addition to the moderate-intensity exercise.  Spend less time sitting. Even light physical activity can be beneficial. Watch cholesterol and blood lipids Have your blood tested for lipids and cholesterol at 50 years of age, then have this test every 5 years. Have your cholesterol levels checked more often if:  Your lipid or cholesterol levels are high.  You are older than 50 years of age.  You are at high risk for heart disease. What should I know about cancer screening? Depending on your health history and family history, you may need to have cancer screening at various ages. This may include screening  for:  Breast cancer.  Cervical cancer.  Colorectal cancer.  Skin cancer.  Lung cancer. What should I know about heart disease, diabetes, and high blood pressure? Blood pressure and heart disease  High blood pressure causes heart disease and increases the risk of stroke. This is more likely to develop in people who have high blood pressure readings, are of African descent, or are overweight.  Have your blood pressure checked: ? Every 3-5 years if you are 81-72 years of age. ? Every year if you are 27 years old or older. Diabetes Have regular diabetes screenings. This checks your fasting blood sugar level. Have the screening done:  Once every three years after age 45 if you are at a normal weight and have a low risk for diabetes.  More often and at a younger age if you are overweight or have a high risk for diabetes. What should I know about preventing infection? Hepatitis B If you have a higher risk for hepatitis B, you should be screened for this virus. Talk with your health care provider to find out if you are at risk for hepatitis B infection. Hepatitis C Testing is recommended for:  Everyone born from [birth year] through 1965.  Anyone with known risk factors for hepatitis C. Sexually transmitted infections (STIs)  Get screened for STIs, including gonorrhea and chlamydia, if: ? You are sexually active and are younger than 50 years of age. ? You are older than 50 years of age and your health care provider tells you that you are at risk for this type  of infection. ? Your sexual activity has changed since you were last screened, and you are at increased risk for chlamydia or gonorrhea. Ask your health care provider if you are at risk.  Ask your health care provider about whether you are at high risk for HIV. Your health care provider may recommend a prescription medicine to help prevent HIV infection. If you choose to take medicine to prevent HIV, you should first get tested for HIV.  You should then be tested every 3 months for as long as you are taking the medicine. Pregnancy  If you are about to stop having your period (premenopausal) and you may become pregnant, seek counseling before you get pregnant.  Take 400 to 800 micrograms (mcg) of folic acid every day if you become pregnant.  Ask for birth control (contraception) if you want to prevent pregnancy. Osteoporosis and menopause Osteoporosis is a disease in which the bones lose minerals and strength with aging. This can result in bone fractures. If you are 57 years old or older, or if you are at risk for osteoporosis and fractures, ask your health care provider if you should:  Be screened for bone loss.  Take a calcium or vitamin D supplement to lower your risk of fractures.  Be given hormone replacement therapy (HRT) to treat symptoms of menopause. Follow these instructions at home: Lifestyle  Do not use any products that contain nicotine or tobacco, such as cigarettes, e-cigarettes, and chewing tobacco. If you need help quitting, ask your health care provider.  Do not use street drugs.  Do not share needles.  Ask your health care provider for help if you need support or information about quitting drugs. Alcohol use  Do not drink alcohol if: ? Your health care provider tells you not to drink. ? You are pregnant, may be pregnant, or are planning to become pregnant.  If you drink alcohol: ? Limit how much you use to 0-1 drink a day. ? Limit intake if you are breastfeeding.  Be aware of how much alcohol is in your drink. In the [place] one drink equals one 12 oz bottle of beer (355 mL), one 5 oz glass of wine (148 mL), or one 1 oz glass of hard liquor (44 mL). General instructions  Schedule regular health, dental, and eye exams.  Stay current with your vaccines.  Tell your health care provider if: ? You often feel depressed. ? You have ever been abused or do not feel safe at  home. Summary  Adopting a healthy lifestyle and getting preventive care are important in promoting health and wellness.  Follow your health care provider's instructions about healthy diet, exercising, and getting tested or screened for diseases.  Follow your health care provider's instructions on monitoring your cholesterol and blood pressure. This information is not intended to replace advice given to you by your health care provider. Make sure you discuss any questions you have with your health care provider. Document Revised: 07/14/2018 Document Reviewed: 07/14/2018 Elsevier Patient Education  2021 [employer].

## 2020-11-05 NOTE — Telephone Encounter (Signed)
Tried calling patient. No answer and VM full. Will try to call again later.

## 2020-11-05 NOTE — Progress Notes (Signed)
BP 121/84   Pulse 69   Temp 98.2 F (36.8 C) (Oral)   Ht 5\' 4"  (1.626 m)   Wt 217 lb (98.4 kg)   LMP 08/04/2016   SpO2 99%   BMI 37.25 kg/m    Subjective:    [name], female    DOB: 15-Sep-1970, 50 y.o.   MRN: [id number]  HPI: Beverly Haley is a 50 y.o. female presenting on 11/05/2020 for comprehensive medical examination. Current medical complaints include:none  Menopausal Symptoms: no  Depression Screen done today and results listed below:  Depression screen [facility] 2/9 11/05/2020 10/11/2020 06/02/2019 04/29/2019 11/19/2018  Decreased Interest 0 0 0 0 0  Down, Depressed, Hopeless 0 0 0 0 0  PHQ - 2 Score 0 0 0 0 0  Altered sleeping - 0 0 2 3  Tired, decreased energy - 0 1 0 0  Change in appetite - 0 0 0 0  Feeling bad or failure about yourself  - 0 0 0 0  Trouble concentrating - 0 0 0 0  Moving slowly or fidgety/restless - 0 0 0 3  Suicidal thoughts - 0 0 0 0  PHQ-9 Score - 0 1 2 6   Difficult doing work/chores - Not difficult at all Not difficult at all Very difficult Extremely dIfficult    Past Medical History:  Past Medical History:  Diagnosis Date  . Bulky or enlarged uterus 05/15/2016  . Hip pain   . Hyperlipidemia   . Migraine   . 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 APPENDECTOMY    . BREAST SURGERY    . HYSTEROSCOPY WITH NOVASURE N/A 06/16/2016   Procedure: HYSTEROSCOPY WITH NOVASURE;  Surgeon: 06/18/2016, MD;  Location: [facility] ORS;  Service: Gynecology;  Laterality: N/A;  . LAPAROSCOPIC VAGINAL HYSTERECTOMY WITH SALPINGECTOMY N/A 12/08/2016   Procedure: LAPAROSCOPIC ASSISTED VAGINAL HYSTERECTOMY WITH BILATERAL SALPINGECTOMY, RIGHT OOPHERECTOMY;  Surgeon: Herold Harms, MD;  Location: [facility] ORS;  Service: Gynecology;   Laterality: N/A;  . LAPAROSCOPY N/A 06/16/2016   Procedure: LAPAROSCOPY DIAGNOSTIC WITH BIOPSIES;  Surgeon: Herold Harms, MD;  Location: [facility] OR[facility];  Service: Gynecology;  Laterality: N/A;  . PLACEMENT OF BREAST IMPLANTS      Medications:  Current Outpatient Medications on File Prior to Visit  Medication Sig  . albuterol (VENTOLIN HFA) 108 (90 Base) MCG/ACT inhaler Inhale 2 puffs into the lungs every 6 (six) hours as needed for wheezing or shortness of breath.   No current facility-administered medications on file prior to visit.    Allergies:  Allergies  Allergen Reactions  . Peanut-Containing Drug Products Other (See Comments)    "severe headache"    Social History:  Social History   Socioeconomic History  . Marital status: Married    Spouse name: Not on file  . Number of children: Not on file  . Years of education: Not on file  . Highest education level: Not on file  Occupational History  . Not on file  Tobacco Use  . Smoking status: Never Smoker  . Smokeless tobacco: Never Used  Vaping Use  . Vaping Use: Never used  Substance and Sexual Activity  . Alcohol use: Yes    Comment: socially  . Drug use: No  . Sexual  activity: Yes    Birth control/protection: None, Surgical    Comment: Spouse had vasectomy  Other Topics Concern  . Not on file  Social History Narrative  . Not on file   Social Determinants of Health   Financial Resource Strain: Not on file  Food Insecurity: Not on file  Transportation Needs: Not on file  Physical Activity: Not on file  Stress: Not on file  Social Connections: Not on file  Intimate Partner Violence: Not on file   Social History   Tobacco Use  Smoking Status Never Smoker  Smokeless Tobacco Never Used   Social History   Substance and Sexual Activity  Alcohol Use Yes   Comment: socially    Family History:  Family History  Problem Relation Age of Onset  . Migraines Mother   . Hyperlipidemia Father   .  Hypertension Father   . Breast cancer Neg Hx   . Ovarian cancer Neg Hx   . Colon cancer Neg Hx   . Diabetes Neg Hx     Past medical history, surgical history, medications, allergies, family history and social history reviewed with patient today and changes made to appropriate areas of the chart.   Review of Systems  Constitutional: Negative.   HENT: Negative.   Eyes: Positive for blurred vision. Negative for double vision, photophobia, pain, discharge and redness.  Respiratory: Negative.   Cardiovascular: Positive for palpitations. Negative for chest pain, orthopnea, claudication, leg swelling and PND.  Gastrointestinal: Positive for blood in stool and heartburn. Negative for abdominal pain, constipation, diarrhea, melena, nausea and vomiting.  Genitourinary: Negative.   Musculoskeletal: Negative.   Skin: Negative.   Neurological: Negative.   Endo/Heme/Allergies: Positive for environmental allergies. Negative for polydipsia. Bruises/bleeds easily.  Psychiatric/Behavioral: Negative for depression, hallucinations, memory loss, substance abuse and suicidal ideas. The patient is nervous/anxious. The patient does not have insomnia.    All other ROS negative except what is listed above and in the HPI.      Objective:    BP 121/84   Pulse 69   Temp 98.2 F (36.8 C) (Oral)   Ht 5\' 4"  (1.626 m)   Wt 217 lb (98.4 kg)   LMP 08/04/2016   SpO2 99%   BMI 37.25 kg/m   Wt Readings from Last 3 Encounters:  11/05/20 217 lb (98.4 kg)  06/06/19 170 lb (77.1 kg)  11/19/18 145 lb (65.8 kg)    Physical Exam Vitals and nursing note reviewed.  Constitutional:      General: She is not in acute distress.    Appearance: Normal appearance. She is not ill-appearing, toxic-appearing or diaphoretic.  HENT:     Head: Normocephalic and atraumatic.     Right Ear: Tympanic membrane, ear canal and external ear normal. There is no impacted cerumen.     Left Ear: Tympanic membrane, ear canal and external  ear normal. There is no impacted cerumen.     Nose: Nose normal. No congestion or rhinorrhea.     Mouth/Throat:     Mouth: Mucous membranes are moist.     Pharynx: Oropharynx is clear. No oropharyngeal exudate or posterior oropharyngeal erythema.  Eyes:     General: No scleral icterus.       Right eye: No discharge.        Left eye: No discharge.     Extraocular Movements: Extraocular movements intact.     Conjunctiva/sclera: Conjunctivae normal.     Pupils: Pupils are equal, round, and reactive to light.  Neck:     Vascular: No carotid bruit.  Cardiovascular:     Rate and Rhythm: Normal rate and regular rhythm.     Pulses: Normal pulses.     Heart sounds: No murmur heard. No friction rub. No gallop.   Pulmonary:     Effort: Pulmonary effort is normal. No respiratory distress.     Breath sounds: Normal breath sounds. No stridor. No wheezing, rhonchi or rales.  Chest:     Chest wall: No tenderness.  Abdominal:     General: Abdomen is flat. Bowel sounds are normal. There is no distension.     Palpations: Abdomen is soft. There is no mass.     Tenderness: There is no abdominal tenderness. There is no right CVA tenderness, left CVA tenderness, guarding or rebound.     Hernia: No hernia is present.  Genitourinary:    Comments: Breast and pelvic exams deferred with shared decision making Musculoskeletal:        General: No swelling, tenderness, deformity or signs of injury.     Cervical back: Normal range of motion and neck supple. No rigidity. No muscular tenderness.     Right lower leg: No edema.     Left lower leg: No edema.  Lymphadenopathy:     Cervical: No cervical adenopathy.  Skin:    General: Skin is warm and dry.     Capillary Refill: Capillary refill takes less than 2 seconds.     Coloration: Skin is not jaundiced or pale.     Findings: No bruising, erythema, lesion or rash.  Neurological:     General: No focal deficit present.     Mental Status: She is alert and  oriented to person, place, and time. Mental status is at baseline.     Cranial Nerves: No cranial nerve deficit.     Sensory: No sensory deficit.     Motor: No weakness.     Coordination: Coordination normal.     Gait: Gait normal.     Deep Tendon Reflexes: Reflexes normal.  Psychiatric:        Mood and Affect: Mood normal.        Behavior: Behavior normal.        Thought Content: Thought content normal.        Judgment: Judgment normal.     Results for orders placed or performed during the hospital encounter of 06/06/19  SARS CORONAVIRUS 2 (TAT 6-24 HRS) Nasopharyngeal   Specimen: Nasopharyngeal  Result Value Ref Range   SARS Coronavirus 2 POSITIVE (A) NEGATIVE  CBC  Result Value Ref Range   WBC 8.6 4.0 - 10.5 K/uL   RBC 4.20 3.87 - 5.11 MIL/uL   Hemoglobin 12.2 12.0 - 15.0 g/dL   HCT 69.6 78.9 - 38.1 %   MCV 88.6 80.0 - 100.0 fL   MCH 29.0 26.0 - 34.0 pg   MCHC 32.8 30.0 - 36.0 g/dL   RDW 01.7 51.0 - 25.8 %   Platelets 326 150 - 400 K/uL   nRBC 0.0 0.0 - 0.2 %  Comprehensive metabolic panel  Result Value Ref Range   Sodium 140 135 - 145 mmol/L   Potassium 3.4 (L) 3.5 - 5.1 mmol/L   Chloride 101 98 - 111 mmol/L   CO2 23 22 - 32 mmol/L   Glucose, Bld 75 70 - 99 mg/dL   BUN 19 6 - 20 mg/dL   Creatinine, Ser 5.27 (H) 0.44 - 1.00 mg/dL   Calcium 9.6 8.9 -  10.3 mg/dL   Total Protein 7.4 6.5 - 8.1 g/dL   Albumin 4.3 3.5 - 5.0 g/dL   AST 24 15 - 41 U/L   ALT 18 0 - 44 U/L   Alkaline Phosphatase 87 38 - 126 U/L   Total Bilirubin 1.2 0.3 - 1.2 mg/dL   GFR calc non Af Amer 50 (L) >60 mL/min   GFR calc Af Amer 58 (L) >60 mL/min   Anion gap 16 (H) 5 - 15  Lipase, blood  Result Value Ref Range   Lipase 21 11 - 51 U/L  Influenza panel by PCR (type A & B)  Result Value Ref Range   Influenza A By PCR NEGATIVE NEGATIVE   Influenza B By PCR NEGATIVE NEGATIVE  Troponin I (High Sensitivity)  Result Value Ref Range   Troponin I (High Sensitivity) 12 <18 ng/L      Assessment  & Plan:   Problem List Items Addressed This Visit   None   Visit Diagnoses    Routine general medical examination at a health care facility    -  Primary   Vaccines up to date/declined. Screening labs checked today. Pap N/A. Mammogram and colonoscopy ordered. Continue diet and exercise. Call with concerns.    Relevant Orders   CBC with Differential/Platelet   Comprehensive metabolic panel   Lipid Panel w/o Chol/HDL Ratio   Urinalysis, Routine w reflex microscopic   TSH   Hepatitis C Antibody   Myopia of both eyes       Referral to opthalmology placed today.   Relevant Orders   Ambulatory referral to Ophthalmology   Screening for colon cancer       Referral to GI placed today.   Relevant Orders   Ambulatory referral to Gastroenterology   Encounter for screening mammogram for malignant neoplasm of breast       Mammogram ordered today.   Relevant Orders   MS DIGITAL SCREENING TOMO W/IMPLANTS BILAT       Follow up plan: Return in about 1 year (around 11/05/2021) for physical.   LABORATORY TESTING:  - Pap smear: not applicable  IMMUNIZATIONS:   - Tdap: Tetanus vaccination status reviewed: last tetanus booster within 10 years. - Influenza: Postponed to flu season - COVID: Refused  SCREENING: -Mammogram: Ordered today  - Colonoscopy: Ordered today   PATIENT COUNSELING:   Advised to take 1 mg of folate supplement per day if capable of pregnancy.   Sexuality: Discussed sexually transmitted diseases, partner selection, use of condoms, avoidance of unintended pregnancy  and contraceptive alternatives.   Advised to avoid cigarette smoking.  I discussed with the patient that most people either abstain from alcohol or drink within safe limits (<=14/week and <=4 drinks/occasion for males, <=7/weeks and <= 3 drinks/occasion for females) and that the risk for alcohol disorders and other health effects rises proportionally with the number of drinks per week and how often a drinker  exceeds daily limits.  Discussed cessation/primary prevention of drug use and availability of treatment for abuse.   Diet: Encouraged to adjust caloric intake to maintain  or achieve ideal body weight, to reduce intake of dietary saturated fat and total fat, to limit sodium intake by avoiding high sodium foods and not adding table salt, and to maintain adequate dietary potassium and calcium preferably from fresh fruits, vegetables, and low-fat dairy products.    stressed the importance of regular exercise  Injury prevention: Discussed safety belts, safety helmets, smoke detector, smoking near bedding or upholstery.  Dental health: Discussed importance of regular tooth brushing, flossing, and dental visits.    NEXT PREVENTATIVE PHYSICAL DUE IN 1 YEAR. Return in about 1 year (around 11/05/2021) for physical.

## 2020-11-05 NOTE — Telephone Encounter (Signed)
Pt called back advised to her that xyzal was sent to pharmacy pt verbalized understanding   Copied from CRM [id number]. Topic: General - Inquiry >> Nov 05, 2020  4:27 PM Elliot Gault wrote: Patient has seasonal allergies and OTC this is not working for sinuses, requesting medication. Patient would like follow up call best [phone]

## 2020-11-06 LAB — CBC WITH DIFFERENTIAL/PLATELET
Basophils Absolute: 0 10*3/uL (ref 0.0–0.2)
Basos: 1 %
EOS (ABSOLUTE): 0.4 10*3/uL (ref 0.0–0.4)
Eos: 6 %
Hematocrit: 37.5 % (ref 34.0–46.6)
Hemoglobin: 12.1 g/dL (ref 11.1–15.9)
Immature Grans (Abs): 0 10*3/uL (ref 0.0–0.1)
Immature Granulocytes: 0 %
Lymphocytes Absolute: 2 10*3/uL (ref 0.7–3.1)
Lymphs: 28 %
MCH: 27.4 pg (ref 26.6–33.0)
MCHC: 32.3 g/dL (ref 31.5–35.7)
MCV: 85 fL (ref 79–97)
Monocytes Absolute: 0.4 10*3/uL (ref 0.1–0.9)
Monocytes: 5 %
Neutrophils Absolute: 4.5 10*3/uL (ref 1.4–7.0)
Neutrophils: 60 %
Platelets: 341 10*3/uL (ref 150–450)
RBC: 4.41 x10E6/uL (ref 3.77–5.28)
RDW: 14.7 % (ref 11.7–15.4)
WBC: 7.4 10*3/uL (ref 3.4–10.8)

## 2020-11-06 LAB — COMPREHENSIVE METABOLIC PANEL
ALT: 12 IU/L (ref 0–32)
AST: 24 IU/L (ref 0–40)
Albumin/Globulin Ratio: 1.7 (ref 1.2–2.2)
Albumin: 3.7 g/dL — ABNORMAL LOW (ref 3.8–4.8)
Alkaline Phosphatase: 70 IU/L (ref 44–121)
BUN/Creatinine Ratio: 18 (ref 9–23)
BUN: 17 mg/dL (ref 6–24)
Bilirubin Total: <0.2 mg/dL (ref 0.0–1.2)
CO2: 21 mmol/L (ref 20–29)
Calcium: 9.7 mg/dL (ref 8.7–10.2)
Chloride: 108 mmol/L — ABNORMAL HIGH (ref 96–106)
Creatinine, Ser: 0.94 mg/dL (ref 0.57–1.00)
Globulin, Total: 2.2 g/dL (ref 1.5–4.5)
Glucose: 80 mg/dL (ref 65–99)
Potassium: 4.5 mmol/L (ref 3.5–5.2)
Sodium: 144 mmol/L (ref 134–144)
Total Protein: 5.9 g/dL — ABNORMAL LOW (ref 6.0–8.5)
eGFR: 74 mL/min/{1.73_m2} (ref >59)

## 2020-11-06 LAB — LIPID PANEL W/O CHOL/HDL RATIO
Cholesterol, Total: 225 mg/dL — ABNORMAL HIGH (ref 100–199)
HDL: 65 mg/dL (ref >39)
LDL Chol Calc (NIH): 134 mg/dL — ABNORMAL HIGH (ref 0–99)
Triglycerides: 148 mg/dL (ref 0–149)
VLDL Cholesterol Cal: 26 mg/dL (ref 5–40)

## 2020-11-06 LAB — URINALYSIS, ROUTINE W REFLEX MICROSCOPIC
Bilirubin, UA: NEGATIVE
Glucose, UA: NEGATIVE
Ketones, UA: NEGATIVE
Leukocytes,UA: NEGATIVE
Nitrite, UA: NEGATIVE
Protein,UA: NEGATIVE
RBC, UA: NEGATIVE
Specific Gravity, UA: 1.015 (ref 1.005–1.030)
Urobilinogen, Ur: 0.2 mg/dL (ref 0.2–1.0)
pH, UA: 6 (ref 5.0–7.5)

## 2020-11-06 LAB — HEPATITIS C ANTIBODY: Hep C Virus Ab: <0.1 s/co ratio (ref 0.0–0.9)

## 2020-11-06 LAB — TSH: TSH: 1.48 u[IU]/mL (ref 0.450–4.500)

## 2020-11-06 NOTE — Telephone Encounter (Signed)
Patient notified

## 2020-11-21 ENCOUNTER — Encounter: Payer: Self-pay | Admitting: *Deleted

## 2021-09-05 ENCOUNTER — Other Ambulatory Visit: Payer: Self-pay | Admitting: Family Medicine

## 2021-09-05 NOTE — Telephone Encounter (Signed)
Requested Prescriptions  Pending Prescriptions Disp Refills   omeprazole (PRILOSEC) 40 MG capsule [Pharmacy Med Name: OMEPRAZOLE DR 40 MG CAPSULE] 90 capsule 0    Sig: Take 1 capsule (40 mg total) by mouth daily.     Gastroenterology: Proton Pump Inhibitors Passed - 09/05/2021 10:13 AM      Passed - Valid encounter within last 12 months    Recent Outpatient Visits          10 months ago Routine general medical examination at a health care facility   [facility], Megan P, DO   10 months ago Gastroesophageal reflux disease, unspecified whether esophagitis present   [facility], Lauren A, NP   1 year ago Intractable migraine with status migrainosus, unspecified migraine type   [facility] Valentino Nose, NP   2 years ago COVID-19   [facility], [place], DO   2 years ago Weight gain   [facility] Particia Nearing, [place]

## 2021-09-18 ENCOUNTER — Ambulatory Visit: Payer: Medicaid Other | Admitting: Family Medicine

## 2021-09-19 ENCOUNTER — Encounter: Payer: Self-pay | Admitting: Family Medicine

## 2021-09-19 ENCOUNTER — Ambulatory Visit
Admission: RE | Admit: 2021-09-19 | Discharge: 2021-09-19 | Disposition: A | Discharge status: Home / Self Care | Payer: Medicaid Other | Attending: Family Medicine | Admitting: Family Medicine

## 2021-09-19 ENCOUNTER — Other Ambulatory Visit: Payer: Self-pay

## 2021-09-19 ENCOUNTER — Ambulatory Visit
Admission: RE | Admit: 2021-09-19 | Discharge: 2021-09-19 | Disposition: A | Discharge status: Home / Self Care | Payer: Medicaid Other | Source: Ambulatory Visit | Attending: Family Medicine | Admitting: Family Medicine

## 2021-09-19 ENCOUNTER — Ambulatory Visit (INDEPENDENT_AMBULATORY_CARE_PROVIDER_SITE_OTHER): Payer: Medicaid Other | Admitting: Family Medicine

## 2021-09-19 VITALS — BP 106/74 | HR 86 | Temp 98.2°F | Wt 155.0 lb

## 2021-09-19 DIAGNOSIS — M5441 Lumbago with sciatica, right side: Secondary | ICD-10-CM

## 2021-09-19 DIAGNOSIS — G8929 Other chronic pain: Secondary | ICD-10-CM

## 2021-09-19 DIAGNOSIS — M5442 Lumbago with sciatica, left side: Secondary | ICD-10-CM

## 2021-09-19 DIAGNOSIS — M5136 Other intervertebral disc degeneration, lumbar region: Secondary | ICD-10-CM | POA: Diagnosis not present

## 2021-09-19 DIAGNOSIS — M545 Low back pain, unspecified: Secondary | ICD-10-CM | POA: Diagnosis not present

## 2021-09-19 DIAGNOSIS — R200 Anesthesia of skin: Secondary | ICD-10-CM | POA: Diagnosis not present

## 2021-09-19 MED ORDER — KETOROLAC TROMETHAMINE 60 MG/2ML IM SOLN
60.0000 mg | Freq: Once | INTRAMUSCULAR | Status: AC
  Administered 2021-09-20: 60 mg via INTRAMUSCULAR

## 2021-09-19 MED ORDER — MELOXICAM 15 MG PO TABS: 15.0000 mg | ORAL_TABLET | Freq: Every day | ORAL | 0 refills | Status: DC
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
## 2021-09-20 DIAGNOSIS — G8929 Other chronic pain: Secondary | ICD-10-CM | POA: Diagnosis not present

## 2021-09-20 DIAGNOSIS — M5441 Lumbago with sciatica, right side: Secondary | ICD-10-CM | POA: Diagnosis not present

## 2021-09-20 DIAGNOSIS — M5442 Lumbago with sciatica, left side: Secondary | ICD-10-CM | POA: Diagnosis not present

## 2021-09-23 ENCOUNTER — Telehealth: Payer: Self-pay | Admitting: Family Medicine

## 2021-09-23 NOTE — Telephone Encounter (Signed)
Patient was able to view imaging results or My Chart but seeking clarity regarding results

## 2021-09-23 NOTE — Telephone Encounter (Signed)
Results have not been reviewed. I will contact patient when they have.

## 2021-09-23 NOTE — Telephone Encounter (Signed)
Please advise 

## 2021-09-23 NOTE — Telephone Encounter (Signed)
Patient aware.

## 2021-09-30 ENCOUNTER — Telehealth: Payer: Self-pay | Admitting: Family Medicine

## 2021-09-30 NOTE — Telephone Encounter (Signed)
Called pt to schedule an appt. Pt states she was just seen for this and would like to go forward with getting PT and back injections.

## 2021-09-30 NOTE — Telephone Encounter (Signed)
error 

## 2021-09-30 NOTE — Telephone Encounter (Signed)
Pt is calling to ask if a referral can be placed for [facility]. Pt told the [facility] that she was not interested bc PT did not help on 09/23/21. Her insurance is requiring her to start PT.  Pt is also requesting a referral to someone that can give her injections in her with her back.  Pt reports that she is also having daily headache. Pt is taking BC.  Please advise   CB- [phone]

## 2021-09-30 NOTE — Telephone Encounter (Signed)
Pt states that if Dr. Laural Benes can do the injection she would prefer if she would do it

## 2021-09-30 NOTE — Telephone Encounter (Signed)
I do not do back injections. She has a referral to neurosurgery already. She will need an MRI but it will not be covered by insurance unless she does PT- she had a referral to PT, which she declined. I would advise her to go to PT so we can get the MRI and see neurosurgery for ?injections as scheduled.

## 2021-10-01 NOTE — Telephone Encounter (Signed)
Called patient to provided patient with Dr.Figley's recommendations. Unable to leave a voice message as the voicemail box was full.   OK for PEC to give note if patient calls back.

## 2021-11-06 ENCOUNTER — Other Ambulatory Visit: Payer: Self-pay | Admitting: Neurosurgery

## 2021-11-06 DIAGNOSIS — M5136 Other intervertebral disc degeneration, lumbar region: Secondary | ICD-10-CM

## 2021-11-06 DIAGNOSIS — M5416 Radiculopathy, lumbar region: Secondary | ICD-10-CM

## 2021-11-14 ENCOUNTER — Ambulatory Visit: Payer: Medicaid Other

## 2021-11-22 ENCOUNTER — Ambulatory Visit: Payer: Medicaid Other

## 2021-12-06 ENCOUNTER — Other Ambulatory Visit: Payer: Self-pay | Admitting: Family Medicine

## 2021-12-06 NOTE — Telephone Encounter (Signed)
Requested medication (s) are due for refill today:   Yes ? ?Requested medication (s) are on the active medication list:   Yes ? ?Future visit scheduled:   No ? ? ?Last ordered: 11/05/2020 #90, 3 refills ? ?Returned because lab work due per protocol  ? ?Requested Prescriptions  ?Pending Prescriptions Disp Refills  ? levocetirizine (XYZAL) 5 MG tablet [Pharmacy Med Name: LEVOCETIRIZINE 5 MG TABLET] 30 tablet 0  ?  Sig: Take 1 tablet (5 mg total) by mouth every evening.  ?  ? Ear, Nose, and Throat:  Antihistamines - levocetirizine dihydrochloride Failed - 12/06/2021  1:50 PM  ?  ?  Failed - Cr in normal range and within 360 days  ?  Creatinine  ?Date Value Ref Range Status  ?01/28/2013 0.95 0.60 - 1.30 mg/dL Final  ? ?Creatinine, Ser  ?Date Value Ref Range Status  ?11/05/2020 0.94 0.57 - 1.00 mg/dL Final  ?  ?  ?  ?  Failed - eGFR is 10 or above and within 360 days  ?  EGFR (African American)  ?Date Value Ref Range Status  ?01/28/2013 >60  Final  ? ?GFR calc Af Wyvonnia Lora  ?Date Value Ref Range Status  ?06/06/2019 58 (L) >60 mL/min Final  ? ?EGFR (Non-African Amer.)  ?Date Value Ref Range Status  ?01/28/2013 >60  Final  ?  Comment:  ?  eGFR values <59m/min/1.73 m2 may be an indication of chronic ?kidney disease (CKD). ?Calculated eGFR is useful in patients with stable renal function. ?The eGFR calculation will not be reliable in acutely ill patients ?when serum creatinine is changing rapidly. It is not useful in  ?patients on dialysis. The eGFR calculation may not be applicable ?to patients at the low and high extremes of body sizes, pregnant ?women, and vegetarians. ?  ? ?GFR calc non Af Amer  ?Date Value Ref Range Status  ?06/06/2019 50 (L) >60 mL/min Final  ? ?eGFR  ?Date Value Ref Range Status  ?11/05/2020 74 >59 mL/min/1.73 Final  ?  ?  ?  ?  Passed - Valid encounter within last 12 months  ?  Recent Outpatient Visits   ? ?      ? 2 months ago Chronic bilateral low back pain with bilateral sciatica  ? [place] DO  ? 1 year ago Routine general medical examination at a health care facility  ? [place], DO  ? 1 year ago Gastroesophageal reflux disease, unspecified whether esophagitis present  ? CM[place]ow [place] NP  ? 1 year ago Intractable migraine with status migrainosus, unspecified migraine type  ? [facility]  ? 2 years ago CCOVID-46 ? [place], DO  ? ?  ?  ? ? ?  ?  ?  ? ?

## 2022-01-06 ENCOUNTER — Encounter: Payer: Self-pay | Admitting: Physician Assistant

## 2022-01-06 ENCOUNTER — Ambulatory Visit (INDEPENDENT_AMBULATORY_CARE_PROVIDER_SITE_OTHER): Payer: Medicaid Other | Admitting: Physician Assistant

## 2022-01-06 VITALS — BP 98/67 | HR 75 | Temp 97.9°F | Wt 151.6 lb

## 2022-01-06 DIAGNOSIS — L255 Unspecified contact dermatitis due to plants, except food: Secondary | ICD-10-CM | POA: Diagnosis not present

## 2022-01-06 MED ORDER — PREDNISONE 10 MG PO TABS: ORAL_TABLET | ORAL | 0 refills | Status: DC

## 2022-01-06 NOTE — Progress Notes (Signed)
Established Patient Office Visit  Name: Beverly Haley   MRN: [id number]    DOB: 07/16/1971   Date:01/06/2022  Today's Provider: Jacquelin HawkingErin Oval Moralez, MHS, PA-C Introduced myself to the patient as a PA-C and provided education on APPs in clinical practice.         Subjective  Chief Complaint  Chief Complaint  Patient presents with   Rash    Started about 4 days ago. Rash is spreading all over. Patient believes it is poison ivy, oak, or sumac.   eye watering    Started 2 days ago     HPI  States she was out in garden weeding and touched her face a bit States her eyes are burning and watering   Onset: 4 days ago Alleviating: nothing Aggravating: nothing, constant itching  Interventions: Calamine lotion, OTC cortisone cream,  Areas impacted: Hands, neck, hairline, forehead,     Patient Active Problem List   Diagnosis Date Noted   Gastroesophageal reflux disease 10/11/2020   Migraine    Major depression, recurrent (HCC) 08/13/2018   PTSD (post-traumatic stress disorder) 08/13/2018   Panic disorder 08/13/2018   Rib fractures 07/08/2018   Anxiety 08/30/2017   Raynaud's phenomenon without gangrene 07/08/2017   Surgical menopause 12/08/2016   Endometriosis 12/02/2016    Past Surgical History:  Procedure Laterality Date   APPENDECTOMY     BREAST SURGERY     HYSTEROSCOPY WITH NOVASURE N/A 06/16/2016   Procedure: HYSTEROSCOPY WITH NOVASURE;  Surgeon: Herold HarmsMartin A Defrancesco, MD;  Location: [facility] OR[facility];  Service: Gynecology;  Laterality: N/A;   LAPAROSCOPIC VAGINAL HYSTERECTOMY WITH SALPINGECTOMY N/A 12/08/2016   Procedure: LAPAROSCOPIC ASSISTED VAGINAL HYSTERECTOMY WITH BILATERAL SALPINGECTOMY, RIGHT OOPHERECTOMY;  Surgeon: Herold Harmsefrancesco, Martin A, MD;  Location: [facility];  Service: Gynecology;  Laterality: N/A;   LAPAROSCOPY N/A 06/16/2016   Procedure: LAPAROSCOPY DIAGNOSTIC WITH BIOPSIES;  Surgeon: Herold HarmsMartin A Defrancesco, MD;  Location: [facility];  Service: Gynecology;   Laterality: N/A;   PLACEMENT OF BREAST IMPLANTS      Family History  Problem Relation Age of Onset   Migraines Mother    Hyperlipidemia Father    Hypertension Father    Breast cancer Neg Hx    Ovarian cancer Neg Hx    Colon cancer Neg Hx    Diabetes Neg Hx     Social History   Tobacco Use   Smoking status: Never   Smokeless tobacco: Never  Substance Use Topics   Alcohol use: Yes    Comment: socially     Current Outpatient Medications:    albuterol (VENTOLIN HFA) 108 (90 Base) MCG/ACT inhaler, Inhale 2 puffs into the lungs every 6 (six) hours as needed for wheezing or shortness of breath., Disp: 18 g, Rfl: 0   levocetirizine (XYZAL) 5 MG tablet, Take 1 tablet (5 mg total) by mouth every evening., Disp: 90 tablet, Rfl: 3   meloxicam (MOBIC) 15 MG tablet, Take 1 tablet (15 mg total) by mouth daily., Disp: 30 tablet, Rfl: 0   omeprazole (PRILOSEC) 40 MG capsule, Take 1 capsule (40 mg total) by mouth daily., Disp: 90 capsule, Rfl: 0  Allergies  Allergen Reactions   Peanut-Containing Drug Products Other (See Comments)    "severe headache"    I personally reviewed active problem list, medication list, allergies with the patient/caregiver today.   Review of Systems  Constitutional:  Negative for chills and weight loss.  Skin:  Positive for itching and rash.  Objective  Vitals:   01/06/22 0902  BP: 98/67  Pulse: 75  Temp: 97.9 F (36.6 C)  TempSrc: Oral  SpO2: 98%  Weight: 151 lb 9.6 oz (68.8 kg)    Body mass index is 26.02 kg/m.  Physical Exam Vitals reviewed.  Constitutional:      General: She is awake.     Appearance: Normal appearance. She is well-developed, well-groomed and normal weight.  HENT:     Head: Normocephalic and atraumatic.  Eyes:     General: Lids are normal. No allergic shiner or scleral icterus.       Right eye: No discharge or hordeolum.        Left eye: No discharge or hordeolum.     Extraocular Movements: Extraocular movements  intact.     Conjunctiva/sclera:     Right eye: Right conjunctiva is not injected. No chemosis, exudate or hemorrhage.    Left eye: Left conjunctiva is not injected. No chemosis, exudate or hemorrhage.    Pupils: Pupils are equal, round, and reactive to light.  Pulmonary:     Effort: Pulmonary effort is normal.  Skin:    General: Skin is warm.     Findings: Erythema and rash present. Rash is macular, papular and vesicular.     Comments: Isolated rashes on lower back, forehead, forearms   Neurological:     Mental Status: She is alert.  Psychiatric:        Attention and Perception: Attention and perception normal.        Mood and Affect: Mood and affect normal.        Speech: Speech normal.        Behavior: Behavior normal. Behavior is cooperative.     No results found for this or any previous visit (from the past 2160 hour(s)).   PHQ2/9:    09/19/2021    3:36 PM 11/05/2020    3:23 PM 10/11/2020    9:23 AM 06/02/2019    2:29 PM 04/29/2019   11:11 AM  Depression screen PHQ 2/9  Decreased Interest 0 0 0 0 0  Down, Depressed, Hopeless 0 0 0 0 0  PHQ - 2 Score 0 0 0 0 0  Altered sleeping 0  0 0 2  Tired, decreased energy 0  0 1 0  Change in appetite 0  0 0 0  Feeling bad or failure about yourself  0  0 0 0  Trouble concentrating 0  0 0 0  Moving slowly or fidgety/restless 0  0 0 0  Suicidal thoughts 0  0 0 0  PHQ-9 Score 0  0 1 2  Difficult doing work/chores   Not difficult at all Not difficult at all Very difficult      Fall Risk:    09/19/2021    3:36 PM 11/05/2020    3:23 PM 09/15/2018    9:43 AM 06/08/2018   11:14 AM  Fall Risk   Falls in the past year? 0 0 0 0  Number falls in past yr: 0     Injury with Fall? 0     Risk for fall due to : No Fall Risks No Fall Risks    Follow up Falls evaluation completed  Falls evaluation completed       Functional Status Survey:      Assessment & Plan  Problem List Items Addressed This Visit   None Visit Diagnoses      Dermatitis due to plants, including poison ivy, sumac, and  oak    -  Primary Acute, new problem Disseminated rash over multiple body areas with itching and irritation Recommend extended prednisone taper to prevent rebound itching Reviewed poison ivy, oak, sumac safety precautions  Reviewed at home treatment to assist with eye irritation  Follow up as needed for persistent or progressing symptoms.     Relevant Medications   predniSONE (DELTASONE) 10 MG tablet        No follow-ups on file.   I, Atiba Kimberlin E Georg Ang, PA-C, have reviewed all documentation for this visit. The documentation on 01/06/22 for the exam, diagnosis, procedures, and orders are all accurate and complete.   Jacquelin Hawking, MHS, PA-C [facility]

## 2022-01-06 NOTE — Patient Instructions (Signed)
For your eyes I recommend the following: Antihistamine eye drops that contain ketotifen or olopatadine  You can use sterile eye flushes to help flush anything out  I also recommend lubricating eye drops such as Blink or Rephresh to help with the irritation  I have sent in a long steroid taper to assist with resolving the itching and rash  Please follow the instructions on the taper  You can continue to use Calamine lotion on any areas that are particularly itchy  You can also take an antihistamine such as Claritin, Zyrtec during the day and benadryl at night to help further   Remember do not burn or touch the plants without skin coverings

## 2022-01-27 ENCOUNTER — Ambulatory Visit: Payer: Self-pay | Admitting: *Deleted

## 2022-01-27 ENCOUNTER — Encounter: Payer: Self-pay | Admitting: Physician Assistant

## 2022-01-27 ENCOUNTER — Ambulatory Visit (INDEPENDENT_AMBULATORY_CARE_PROVIDER_SITE_OTHER): Payer: Medicaid Other | Admitting: Physician Assistant

## 2022-01-27 VITALS — BP 113/72 | HR 78 | Temp 98.5°F | Ht 64.02 in | Wt 154.2 lb

## 2022-01-27 DIAGNOSIS — F419 Anxiety disorder, unspecified: Secondary | ICD-10-CM

## 2022-01-27 DIAGNOSIS — Z8659 Personal history of other mental and behavioral disorders: Secondary | ICD-10-CM | POA: Diagnosis not present

## 2022-01-27 DIAGNOSIS — L255 Unspecified contact dermatitis due to plants, except food: Secondary | ICD-10-CM | POA: Diagnosis not present

## 2022-01-27 DIAGNOSIS — F515 Nightmare disorder: Secondary | ICD-10-CM | POA: Diagnosis not present

## 2022-01-27 MED ORDER — PRAZOSIN HCL 1 MG PO CAPS: 1.0000 mg | ORAL_CAPSULE | Freq: Every day | ORAL | 0 refills | Status: DC

## 2022-01-27 MED ORDER — PREDNISONE 10 MG PO TABS: ORAL_TABLET | ORAL | 0 refills | Status: DC

## 2022-01-27 MED ORDER — HYDROXYZINE PAMOATE 25 MG PO CAPS: 25.0000 mg | ORAL_CAPSULE | Freq: Three times a day (TID) | ORAL | 0 refills | Status: DC | PRN

## 2022-01-27 NOTE — Assessment & Plan Note (Signed)
Chronic, recurrent Reports she is having trouble with anxiety and is having nightmares frequently  Reports difficulty sleeping and generalized anxiety Will start prazosin 1 mg PO QHS to assist with nightmares and hopefully improve sleep thus assist with mental health. Reviewed that this may cause some decrease in BP - may cause dizziness, Recommend follow up in 4 weeks to discuss response

## 2022-01-28 ENCOUNTER — Other Ambulatory Visit: Payer: Self-pay | Admitting: Physician Assistant

## 2022-01-28 DIAGNOSIS — Z8659 Personal history of other mental and behavioral disorders: Secondary | ICD-10-CM

## 2022-01-28 DIAGNOSIS — F419 Anxiety disorder, unspecified: Secondary | ICD-10-CM

## 2022-01-28 DIAGNOSIS — F515 Nightmare disorder: Secondary | ICD-10-CM

## 2022-01-31 ENCOUNTER — Other Ambulatory Visit: Payer: Self-pay | Admitting: Family Medicine

## 2022-01-31 NOTE — Telephone Encounter (Signed)
Requested medication (s) are due for refill today: yes  Requested medication (s) are on the active medication list: no  Last refill:  10/08/21  Future visit scheduled: no  Notes to clinic:  rx was dc'd on 01/27/22. Please assess if refill appropriate     Requested Prescriptions  Pending Prescriptions Disp Refills   levocetirizine (XYZAL) 5 MG tablet [Pharmacy Med Name: LEVOCETIRIZINE 5 MG TABLET] 30 tablet 0    Sig: Take 1 tablet (5 mg total) by mouth every evening.     Ear, Nose, and Throat:  Antihistamines - levocetirizine dihydrochloride Failed - 01/31/2022  8:36 AM      Failed - Cr in normal range and within 360 days    Creatinine  Date Value Ref Range Status  01/28/2013 0.95 0.60 - 1.30 mg/dL Final   Creatinine, Ser  Date Value Ref Range Status  11/05/2020 0.94 0.57 - 1.00 mg/dL Final         Failed - eGFR is 10 or above and within 360 days    EGFR (African American)  Date Value Ref Range Status  01/28/2013 >60  Final   GFR calc Af Amer  Date Value Ref Range Status  06/06/2019 58 (L) >60 mL/min Final   EGFR (Non-African Amer.)  Date Value Ref Range Status  01/28/2013 >60  Final    Comment:    eGFR values <60mL/min/1.73 m2 may be an indication of chronic kidney disease (CKD). Calculated eGFR is useful in patients with stable renal function. The eGFR calculation will not be reliable in acutely ill patients when serum creatinine is changing rapidly. It is not useful in  patients on dialysis. The eGFR calculation may not be applicable to patients at the low and high extremes of body sizes, pregnant women, and vegetarians.    GFR calc non Af Amer  Date Value Ref Range Status  06/06/2019 50 (L) >60 mL/min Final   eGFR  Date Value Ref Range Status  11/05/2020 74 >59 mL/min/1.73 Final         Passed - Valid encounter within last 12 months    Recent Outpatient Visits           4 days ago Dermatitis due to plants, including poison ivy, sumac, and oak    [facility] Mecum, Erin E, PA-C   3 weeks ago Dermatitis due to plants, including poison ivy, sumac, and oak   [facility] Mecum, Erin E, PA-C   4 months ago Chronic bilateral low back pain with bilateral sciatica   [facility] Drennen, Megan P, DO   1 year ago Routine general medical examination at a health care facility   [facility] Wiers, Megan P, DO   1 year ago Gastroesophageal reflux disease, unspecified whether esophagitis present   [facility] McElwee, Lauren A, NP               

## 2022-03-21 ENCOUNTER — Encounter: Payer: Self-pay | Admitting: Nurse Practitioner

## 2022-03-21 ENCOUNTER — Ambulatory Visit (INDEPENDENT_AMBULATORY_CARE_PROVIDER_SITE_OTHER): Payer: Medicaid Other | Admitting: Nurse Practitioner

## 2022-03-21 DIAGNOSIS — J301 Allergic rhinitis due to pollen: Secondary | ICD-10-CM | POA: Diagnosis not present

## 2022-03-21 DIAGNOSIS — J309 Allergic rhinitis, unspecified: Secondary | ICD-10-CM | POA: Insufficient documentation

## 2022-03-21 MED ORDER — HYDROXYZINE PAMOATE 25 MG PO CAPS
25.0000 mg | ORAL_CAPSULE | Freq: Three times a day (TID) | ORAL | 0 refills | Status: DC | PRN
Start: 2022-03-21 — End: 2022-03-21

## 2022-03-21 MED ORDER — HYDROXYZINE PAMOATE 25 MG PO CAPS: 25.0000 mg | ORAL_CAPSULE | Freq: Three times a day (TID) | ORAL | 4 refills | Status: DC | PRN

## 2022-03-21 NOTE — Progress Notes (Signed)
LMP 08/04/2016    Subjective:    [name], female    DOB: [birth year], 50 y.o.   MRN: [id number]  HPI: Beverly Haley is a 51 y.o. female  Chief Complaint  Patient presents with   Allergic Rhinitis     Patient requesting refill on allergy medication   This visit was completed via telephone due to the restrictions of the COVID-19 pandemic. All issues as above were discussed and addressed but no physical exam was performed. If it was felt that the patient should be evaluated in the office, they were directed there. The patient verbally consented to this visit. Patient was unable to complete an audio/visual visit due to Technical difficulties", "Lack of internet. Due to the catastrophic nature of the COVID-19 pandemic, this visit was done through audio contact only. Location of the patient: work Location of the provider: work Those involved with this call:  Provider: Aura Dials, DNP CMA: Malen Gauze, CMA Front Desk/Registration: [employer]  Time spent on call:  21 minutes on the phone discussing health concerns. 15 minutes total spent in review of patient's record and preparation of their chart.  I verified patient identity using two factors (patient name and date of birth). Patient consents verbally to being seen via telemedicine visit today.    ALLERGIES Needs refills on her allergy medications today.  Is taking Hydroxyzine for allergies, takes once a day at this times.  Her allergies are all year round. Duration: chronic Runny nose: no Nasal congestion: yes Nasal itching: yes Sneezing: yes Eye swelling, itching or discharge: yes Post nasal drip: yes Cough: no Sinus pressure: on occasion Ear pain: no Ear pressure: no Fever: no Symptoms occur seasonally: no Symptoms occur perenially: yes Satisfied with current treatment: yes Allergist evaluation in past: yes Allergen injection immunotherapy: no Recurrent sinus infections: no ENT  evaluation in past: yes Known environmental allergy: yes Indoor pets: yes History of asthma: no Current allergy medications: Vistaril Treatments attempted: Claritin, Allegra, Xyzal -- all OTC medications  Relevant past medical, surgical, family and social history reviewed and updated as indicated. Interim medical history since our last visit reviewed. Allergies and medications reviewed and updated.  Review of Systems  Constitutional:  Negative for activity change, appetite change, diaphoresis, fatigue and fever.  HENT:  Positive for congestion, postnasal drip, rhinorrhea and sneezing. Negative for sinus pressure, sinus pain and sore throat.   Respiratory:  Negative for cough, chest tightness and shortness of breath.   Cardiovascular:  Negative for chest pain, palpitations and leg swelling.  Gastrointestinal: Negative.   Psychiatric/Behavioral: Negative.      Per HPI unless specifically indicated above     Objective:    LMP 08/04/2016   Wt Readings from Last 3 Encounters:  01/27/22 154 lb 3.2 oz (69.9 kg)  01/06/22 151 lb 9.6 oz (68.8 kg)  09/19/21 155 lb (70.3 kg)    Physical Exam  Unable to perform due to telephone visit only.  Results for orders placed or performed in visit on 11/05/20  CBC with Differential/Platelet  Result Value Ref Range   WBC 7.4 3.4 - 10.8 x10E3/uL   RBC 4.41 3.77 - 5.28 x10E6/uL   Hemoglobin 12.1 11.1 - 15.9 g/dL   Hematocrit 52.9 09.5 - 46.6 %   MCV 85 79 - 97 fL   MCH 27.4 26.6 - 33.0 pg   MCHC 32.3 31.5 - 35.7 g/dL   RDW 04.7 39.4 - 26.1 %   Platelets 341 150 -  450 x10E3/uL   Neutrophils 60 Not Estab. %   Lymphs 28 Not Estab. %   Monocytes 5 Not Estab. %   Eos 6 Not Estab. %   Basos 1 Not Estab. %   Neutrophils Absolute 4.5 1.4 - 7.0 x10E3/uL   Lymphocytes Absolute 2.0 0.7 - 3.1 x10E3/uL   Monocytes Absolute 0.4 0.1 - 0.9 x10E3/uL   EOS (ABSOLUTE) 0.4 0.0 - 0.4 x10E3/uL   Basophils Absolute 0.0 0.0 - 0.2 x10E3/uL   Immature  Granulocytes 0 Not Estab. %   Immature Grans (Abs) 0.0 0.0 - 0.1 x10E3/uL  Comprehensive metabolic panel  Result Value Ref Range   Glucose 80 65 - 99 mg/dL   BUN 17 6 - 24 mg/dL   Creatinine, Ser 0.94 0.57 - 1.00 mg/dL   eGFR 74 >59 mL/min/1.73   BUN/Creatinine Ratio 18 9 - 23   Sodium 144 134 - 144 mmol/L   Potassium 4.5 3.5 - 5.2 mmol/L   Chloride 108 (H) 96 - 106 mmol/L   CO2 21 20 - 29 mmol/L   Calcium 9.7 8.7 - 10.2 mg/dL   Total Protein 5.9 (L) 6.0 - 8.5 g/dL   Albumin 3.7 (L) 3.8 - 4.8 g/dL   Globulin, Total 2.2 1.5 - 4.5 g/dL   Albumin/Globulin Ratio 1.7 1.2 - 2.2   Bilirubin Total <0.2 0.0 - 1.2 mg/dL   Alkaline Phosphatase 70 44 - 121 IU/L   AST 24 0 - 40 IU/L   ALT 12 0 - 32 IU/L  Lipid Panel w/o Chol/HDL Ratio  Result Value Ref Range   Cholesterol, Total 225 (H) 100 - 199 mg/dL   Triglycerides 148 0 - 149 mg/dL   HDL 65 >39 mg/dL   VLDL Cholesterol Cal 26 5 - 40 mg/dL   LDL Chol Calc (NIH) 134 (H) 0 - 99 mg/dL  Urinalysis, Routine w reflex microscopic  Result Value Ref Range   Specific Gravity, UA 1.015 1.005 - 1.030   pH, UA 6.0 5.0 - 7.5   Color, UA Yellow Yellow   Appearance Ur Clear Clear   Leukocytes,UA Negative Negative   Protein,UA Negative Negative/Trace   Glucose, UA Negative Negative   Ketones, UA Negative Negative   RBC, UA Negative Negative   Bilirubin, UA Negative Negative   Urobilinogen, Ur 0.2 0.2 - 1.0 mg/dL   Nitrite, UA Negative Negative  TSH  Result Value Ref Range   TSH 1.480 0.450 - 4.500 uIU/mL  Hepatitis C Antibody  Result Value Ref Range   Hep C Virus Ab <0.1 0.0 - 0.9 s/co ratio      Assessment & Plan:   Problem List Items Addressed This Visit       Respiratory   Allergic rhinitis - Primary    Chronic, ongoing.  Has tried all OTC medications without benefit.  Refills send on Vistaril and to scheduled physical with PCP.      Relevant Medications   hydrOXYzine (VISTARIL) 25 MG capsule     Follow up plan: Return in  about 6 months (around 09/21/2022) for Physical needed.

## 2022-03-21 NOTE — Progress Notes (Signed)
Pt scheduled  

## 2022-03-21 NOTE — Patient Instructions (Signed)
Allergic Rhinitis, Adult  Allergic rhinitis is an allergic reaction that affects the mucous membrane inside the nose. The mucous membrane is the tissue that produces mucus. There are two types of allergic rhinitis: Seasonal. This type is also called hay fever and happens only during certain seasons. Perennial. This type can happen at any time of the year. Allergic rhinitis cannot be spread from person to person. This condition can be mild, moderate, or severe. It can develop at any age and may be outgrown. What are the causes? This condition is caused by allergens. These are things that can cause an allergic reaction. Allergens may differ for seasonal allergic rhinitis and perennial allergic rhinitis. Seasonal allergic rhinitis is triggered by pollen. Pollen can come from grasses, trees, and weeds. Perennial allergic rhinitis may be triggered by: Dust mites. Proteins in a pet's urine, saliva, or dander. Dander is dead skin cells from a pet. Smoke, mold, or car fumes. What increases the risk? You are more likely to develop this condition if you have a family history of allergies or other conditions related to allergies, including: Allergic conjunctivitis. This is inflammation of parts of the eyes and eyelids. Asthma. This condition affects the lungs and makes it hard to breathe. Atopic dermatitis or eczema. This is long term (chronic) inflammation of the skin. Food allergies. What are the signs or symptoms? Symptoms of this condition include: Sneezing or coughing. A stuffy nose (nasal congestion), itchy nose, or nasal discharge. Itchy eyes and tearing of the eyes. A feeling of mucus dripping down the back of your throat (postnasal drip). Trouble sleeping. Tiredness or fatigue. Headache. Sore throat. How is this diagnosed? This condition may be diagnosed with your symptoms, medical history, and physical exam. Your health care provider may check for related conditions, such  as: Asthma. Pink eye. This is eye inflammation caused by infection (conjunctivitis). Ear infection. Upper respiratory infection. This is an infection in the nose, throat, or upper airways. You may also have tests to find out which allergens trigger your symptoms. These may include skin tests or blood tests. How is this treated? There is no cure for this condition, but treatment can help control symptoms. Treatment may include: Taking medicines that block allergy symptoms, such as corticosteroids and antihistamines. Medicine may be given as a shot, nasal spray, or pill. Avoiding any allergens. Being exposed again and again to tiny amounts of allergens to help you build a defense against allergens (immunotherapy). This is done if other treatments have not helped. It may include: Allergy shots. These are injected medicines that have small amounts of allergen in them. Sublingual immunotherapy. This involves taking small doses of a medicine with allergen in it under your tongue. If these treatments do not work, your health care provider may prescribe newer, stronger medicines. Follow these instructions at home: Avoiding allergens Find out what you are allergic to and avoid those allergens. These are some things you can do to help avoid allergens: If you have perennial allergies: Replace carpet with wood, tile, or vinyl flooring. Carpet can trap dander and dust. Do not smoke. Do not allow smoking in your home. Change your heating and air conditioning filters at least once a month. If you have seasonal allergies, take these steps during allergy season: Keep windows closed as much as possible. Plan outdoor activities when pollen counts are lowest. Check pollen counts before you plan outdoor activities. When coming indoors, change clothing and shower before sitting on furniture or bedding. If you have a pet   in the house that produces allergens: Keep the pet out of the bedroom. Vacuum, sweep, and  dust regularly. General instructions Take over-the-counter and prescription medicines only as told by your health care provider. Drink enough fluid to keep your urine pale yellow. Keep all follow-up visits as told by your health care provider. This is important. Where to find more information American Academy of Allergy, Asthma & Immunology: www.aaaai.org Contact a health care provider if: You have a fever. You develop a cough that does not go away. You make whistling sounds when you breathe (wheeze). Your symptoms slow you down or stop you from doing your normal activities each day. Get help right away if: You have shortness of breath. This symptom may represent a serious problem that is an emergency. Do not wait to see if the symptom will go away. Get medical help right away. Call your local emergency services (911 in the [place] Do not drive yourself to the hospital. Summary Allergic rhinitis may be managed by taking medicines as directed and avoiding allergens. If you have seasonal allergies, keep windows closed as much as possible during allergy season. Contact your health care provider if you develop a fever or a cough that does not go away. This information is not intended to replace advice given to you by your health care provider. Make sure you discuss any questions you have with your health care provider. Document Revised: 09/09/2019 Document Reviewed: 07/19/2019 Elsevier Patient Education  2023 Elsevier Inc.  

## 2022-03-21 NOTE — Assessment & Plan Note (Signed)
Chronic, ongoing.  Has tried all OTC medications without benefit.  Refills send on Vistaril and to scheduled physical with PCP.

## 2022-05-15 ENCOUNTER — Other Ambulatory Visit: Payer: Self-pay | Admitting: Family Medicine

## 2022-05-15 NOTE — Telephone Encounter (Signed)
Unable to refill per protocol, Rx expired. Medication was discontinued 01/27/22. Will refuse request.   Requested Prescriptions  Pending Prescriptions Disp Refills  . levocetirizine (XYZAL) 5 MG tablet [Pharmacy Med Name: LEVOCETIRIZINE 5 MG TABLET] 30 tablet 0    Sig: Take 1 tablet (5 mg total) by mouth every evening.     Ear, Nose, and Throat:  Antihistamines - levocetirizine dihydrochloride Failed - 05/15/2022  9:20 AM      Failed - Cr in normal range and within 360 days    Creatinine  Date Value Ref Range Status  01/28/2013 0.95 0.60 - 1.30 mg/dL Final   Creatinine, Ser  Date Value Ref Range Status  11/05/2020 0.94 0.57 - 1.00 mg/dL Final         Failed - eGFR is 10 or above and within 360 days    EGFR (African American)  Date Value Ref Range Status  01/28/2013 >60  Final   GFR calc Af Amer  Date Value Ref Range Status  06/06/2019 58 (L) >60 mL/min Final   EGFR (Non-African Amer.)  Date Value Ref Range Status  01/28/2013 >60  Final    Comment:    eGFR values <22m/min/1.73 m2 may be an indication of chronic kidney disease (CKD). Calculated eGFR is useful in patients with stable renal function. The eGFR calculation will not be reliable in acutely ill patients when serum creatinine is changing rapidly. It is not useful in  patients on dialysis. The eGFR calculation may not be applicable to patients at the low and high extremes of body sizes, pregnant women, and vegetarians.    GFR calc non Af Amer  Date Value Ref Range Status  06/06/2019 50 (L) >60 mL/min Final   eGFR  Date Value Ref Range Status  11/05/2020 74 >59 mL/min/1.73 Final         Passed - Valid encounter within last 12 months    Recent Outpatient Visits          1 month ago [facility]-seasonal allergic rhinitis due to pollen   [facility]   3 months ago Dermatitis due to plants, including poison ivy, sumac, and oak   [facility] E, PA-C   4  months ago Dermatitis due to plants, including poison ivy, sumac, and oak   [facility] Erin E, PA-C   7 months ago Chronic bilateral low back pain with bilateral sciatica   [facility] Megan P, DO   1 year ago Routine general medical examination at a health care facility   [place] M[facility] Merino DO      Future Appointments            In 3 months JWynetta Emery MBarb Merino DO [facility]

## 2022-08-08 ENCOUNTER — Other Ambulatory Visit: Payer: Self-pay | Admitting: Family Medicine

## 2022-08-08 DIAGNOSIS — J301 Allergic rhinitis due to pollen: Secondary | ICD-10-CM

## 2022-08-08 MED ORDER — HYDROXYZINE PAMOATE 25 MG PO CAPS: 25.0000 mg | ORAL_CAPSULE | Freq: Three times a day (TID) | ORAL | 0 refills | Status: DC | PRN

## 2022-08-08 NOTE — Telephone Encounter (Signed)
Medication Refill - Medication: hydrOXYzine (VISTARIL) 25 MG capsule [[id number]]  Needs enough until cpe appt on 1.18.24/ please advise   Has the patient contacted their pharmacy? Yes.   (Agent: If no, request that the patient contact the pharmacy for the refill. If patient does not wish to contact the pharmacy document the reason why and proceed with request.) (Agent: If yes, when and what did the pharmacy advise?)  Call pcp   Preferred Pharmacy (with phone number or street name): [address]  Has the patient been seen for an appointment in the last year OR does the patient have an upcoming appointment? Yes.    Agent: Please be advised that RX refills may take up to 3 business days. We ask that you follow-up with your pharmacy.

## 2022-08-08 NOTE — Telephone Encounter (Signed)
Requested Prescriptions  Pending Prescriptions Disp Refills   hydrOXYzine (VISTARIL) 25 MG capsule 90 capsule 0    Sig: Take 1 capsule (25 mg total) by mouth every 8 (eight) hours as needed.     Ear, Nose, and Throat:  Antihistamines 2 Failed - 08/08/2022 11:09 AM      Failed - Cr in normal range and within 360 days    Creatinine  Date Value Ref Range Status  01/28/2013 0.95 0.60 - 1.30 mg/dL Final   Creatinine, Ser  Date Value Ref Range Status  11/05/2020 0.94 0.57 - 1.00 mg/dL Final         Passed - Valid encounter within last 12 months    Recent Outpatient Visits           4 months ago Non-seasonal allergic rhinitis due to pollen   [facility], Barbaraann Faster, NP   6 months ago Dermatitis due to plants, including poison ivy, sumac, and oak   [employer], Erin E, PA-C   7 months ago Dermatitis due to plants, including poison ivy, sumac, and oak   [employer], Erin E, PA-C   10 months ago Chronic bilateral low back pain with bilateral sciatica   [employer], Megan P, DO   1 year ago Routine general medical examination at a health care facility   [place], Barb Merino, DO       Future Appointments             In 1 week Wynetta Emery, Barb Merino, DO [facility]

## 2022-08-21 ENCOUNTER — Encounter: Payer: Medicaid Other | Admitting: Family Medicine

## 2022-10-06 ENCOUNTER — Other Ambulatory Visit: Payer: Self-pay | Admitting: Family Medicine

## 2022-10-06 NOTE — Telephone Encounter (Signed)
Pt needs to schedule physical appt.  Requested Prescriptions  Pending Prescriptions Disp Refills   omeprazole (PRILOSEC) 40 MG capsule [Pharmacy Med Name: OMEPRAZOLE DR 40 MG CAPSULE] 30 capsule 0    Sig: Take 1 capsule (40 mg total) by mouth daily.     Gastroenterology: Proton Pump Inhibitors Passed - 10/06/2022  9:08 AM      Passed - Valid encounter within last 12 months    Recent Outpatient Visits           6 months ago Non-seasonal allergic rhinitis due to pollen   [facility], Henrine Screws T, NP   8 months ago Dermatitis due to plants, including poison ivy, sumac, and oak   [place], PA-C   9 months ago Dermatitis due to plants, including poison ivy, sumac, and oak   [place], PA-C   1 year ago Chronic bilateral low back pain with bilateral sciatica   [place], DO   1 year ago Routine general medical examination at a health care facility   [facility], [place], DO

## 2022-10-06 NOTE — Telephone Encounter (Signed)
Patient called, left VM to return the call to the office to scheduled an appt for medication refill request.   

## 2022-10-21 ENCOUNTER — Ambulatory Visit: Payer: Medicaid Other | Admitting: Nurse Practitioner

## 2022-10-22 ENCOUNTER — Ambulatory Visit (INDEPENDENT_AMBULATORY_CARE_PROVIDER_SITE_OTHER): Payer: Medicaid Other | Admitting: Nurse Practitioner

## 2022-10-22 ENCOUNTER — Encounter: Payer: Self-pay | Admitting: Nurse Practitioner

## 2022-10-22 VITALS — BP 142/87 | HR 68 | Temp 98.2°F | Ht 64.02 in | Wt 153.7 lb

## 2022-10-22 DIAGNOSIS — R0981 Nasal congestion: Secondary | ICD-10-CM | POA: Diagnosis not present

## 2022-10-22 DIAGNOSIS — J0110 Acute frontal sinusitis, unspecified: Secondary | ICD-10-CM | POA: Diagnosis not present

## 2022-10-22 DIAGNOSIS — R490 Dysphonia: Secondary | ICD-10-CM

## 2022-10-22 DIAGNOSIS — J029 Acute pharyngitis, unspecified: Secondary | ICD-10-CM

## 2022-10-22 MED ORDER — AMOXICILLIN 500 MG PO CAPS: 500.0000 mg | ORAL_CAPSULE | Freq: Two times a day (BID) | ORAL | 0 refills | Status: AC

## 2022-10-22 MED ORDER — LEVOCETIRIZINE DIHYDROCHLORIDE 5 MG PO TABS
5.0000 mg | ORAL_TABLET | Freq: Every evening | ORAL | 1 refills | Status: DC
Start: 2022-10-22 — End: 2023-06-30

## 2022-10-22 NOTE — Progress Notes (Unsigned)
BP (!) 142/87   Pulse 68   Temp 98.2 F (36.8 C) (Oral)   Ht 5' 4.02" (1.626 m)   Wt 153 lb 11 oz (69.7 kg)   LMP 08/04/2016   SpO2 96%   BMI 26.37 kg/m    Subjective:    [name], female    DOB: [birth year], 52 y.o.   MRN: [id number]  HPI: Beverly Haley is a 52 y.o. female  Chief Complaint  Patient presents with   Sore Throat    Nasal,head congestion, hoarse, has tried otc meds with no relief since Friday.   Medication Refill    Patient states that she is also here for a refill of medication that starts with an L that helps with her sinus, can't remember the name.    UPPER RESPIRATORY TRACT INFECTION Worst symptom: Symptoms started on Saturday morning. Fever:  100.5 the other day Cough: yes Shortness of breath: yes Wheezing: no Chest pain: yes Chest tightness: yes Chest congestion: yes Nasal congestion: yes Runny nose: yes Post nasal drip: yes Sneezing: yes Sore throat: yes Swollen glands:  yes Sinus pressure: yes Headache: yes Face pain: yes Toothache: no Ear pain: yes bilateral Ear pressure: yes bilateral Eyes red/itching:no Eye drainage/crusting: no  Vomiting: no Rash: no Fatigue: yes Sick contacts: yes Strep contacts: no  Context: worse Recurrent sinusitis: no Relief with OTC cold/cough medications: no  Treatments attempted: cold/sinus   Relevant past medical, surgical, family and social history reviewed and updated as indicated. Interim medical history since our last visit reviewed. Allergies and medications reviewed and updated.  Review of Systems  Constitutional:  Positive for fatigue and fever.  HENT:  Positive for congestion, ear pain, postnasal drip, rhinorrhea, sinus pressure, sinus pain, sneezing and sore throat. Negative for dental problem.   Respiratory:  Positive for cough and shortness of breath. Negative for wheezing.   Cardiovascular:  Negative for chest pain.  Gastrointestinal:  Negative for vomiting.   Skin:  Negative for rash.  Neurological:  Positive for headaches.    Per HPI unless specifically indicated above     Objective:    BP (!) 142/87   Pulse 68   Temp 98.2 F (36.8 C) (Oral)   Ht 5' 4.02" (1.626 m)   Wt 153 lb 11 oz (69.7 kg)   LMP 08/04/2016   SpO2 96%   BMI 26.37 kg/m   Wt Readings from Last 3 Encounters:  10/22/22 153 lb 11 oz (69.7 kg)  01/27/22 154 lb 3.2 oz (69.9 kg)  01/06/22 151 lb 9.6 oz (68.8 kg)    Physical Exam Vitals and nursing note reviewed.  Constitutional:      General: She is not in acute distress.    Appearance: Normal appearance. She is normal weight. She is not ill-appearing, toxic-appearing or diaphoretic.  HENT:     Head: Normocephalic.     Right Ear: External ear normal. A middle ear effusion is present.     Left Ear: External ear normal. A middle ear effusion is present.     Nose: Congestion and rhinorrhea present.     Right Sinus: Frontal sinus tenderness present.     Left Sinus: Frontal sinus tenderness present.     Mouth/Throat:     Mouth: Mucous membranes are moist.     Pharynx: Oropharynx is clear. Posterior oropharyngeal erythema present. No oropharyngeal exudate.  Eyes:     General:        Right eye: No discharge.  Left eye: No discharge.     Extraocular Movements: Extraocular movements intact.     Conjunctiva/sclera: Conjunctivae normal.     Pupils: Pupils are equal, round, and reactive to light.  Cardiovascular:     Rate and Rhythm: Normal rate and regular rhythm.     Heart sounds: No murmur heard. Pulmonary:     Effort: Pulmonary effort is normal. No respiratory distress.     Breath sounds: Normal breath sounds. No wheezing or rales.  Musculoskeletal:     Cervical back: Normal range of motion and neck supple.  Skin:    General: Skin is warm and dry.     Capillary Refill: Capillary refill takes less than 2 seconds.  Neurological:     General: No focal deficit present.     Mental Status: She is alert and  oriented to person, place, and time. Mental status is at baseline.  Psychiatric:        Mood and Affect: Mood normal.        Behavior: Behavior normal.        Thought Content: Thought content normal.        Judgment: Judgment normal.     Results for orders placed or performed in visit on 11/05/20  CBC with Differential/Platelet  Result Value Ref Range   WBC 7.4 3.4 - 10.8 x10E3/uL   RBC 4.41 3.77 - 5.28 x10E6/uL   Hemoglobin 12.1 11.1 - 15.9 g/dL   Hematocrit 37.5 34.0 - 46.6 %   MCV 85 79 - 97 fL   MCH 27.4 26.6 - 33.0 pg   MCHC 32.3 31.5 - 35.7 g/dL   RDW 14.7 11.7 - 15.4 %   Platelets 341 150 - 450 x10E3/uL   Neutrophils 60 Not Estab. %   Lymphs 28 Not Estab. %   Monocytes 5 Not Estab. %   Eos 6 Not Estab. %   Basos 1 Not Estab. %   Neutrophils Absolute 4.5 1.4 - 7.0 x10E3/uL   Lymphocytes Absolute 2.0 0.7 - 3.1 x10E3/uL   Monocytes Absolute 0.4 0.1 - 0.9 x10E3/uL   EOS (ABSOLUTE) 0.4 0.0 - 0.4 x10E3/uL   Basophils Absolute 0.0 0.0 - 0.2 x10E3/uL   Immature Granulocytes 0 Not Estab. %   Immature Grans (Abs) 0.0 0.0 - 0.1 x10E3/uL  Comprehensive metabolic panel  Result Value Ref Range   Glucose 80 65 - 99 mg/dL   BUN 17 6 - 24 mg/dL   Creatinine, Ser 0.94 0.57 - 1.00 mg/dL   eGFR 74 >59 mL/min/1.73   BUN/Creatinine Ratio 18 9 - 23   Sodium 144 134 - 144 mmol/L   Potassium 4.5 3.5 - 5.2 mmol/L   Chloride 108 (H) 96 - 106 mmol/L   CO2 21 20 - 29 mmol/L   Calcium 9.7 8.7 - 10.2 mg/dL   Total Protein 5.9 (L) 6.0 - 8.5 g/dL   Albumin 3.7 (L) 3.8 - 4.8 g/dL   Globulin, Total 2.2 1.5 - 4.5 g/dL   Albumin/Globulin Ratio 1.7 1.2 - 2.2   Bilirubin Total <0.2 0.0 - 1.2 mg/dL   Alkaline Phosphatase 70 44 - 121 IU/L   AST 24 0 - 40 IU/L   ALT 12 0 - 32 IU/L  Lipid Panel w/o Chol/HDL Ratio  Result Value Ref Range   Cholesterol, Total 225 (H) 100 - 199 mg/dL   Triglycerides 148 0 - 149 mg/dL   HDL 65 >39 mg/dL   VLDL Cholesterol Cal 26 5 - 40 mg/dL   LDL  Chol Calc (NIH)  134 (H) 0 - 99 mg/dL  Urinalysis, Routine w reflex microscopic  Result Value Ref Range   Specific Gravity, UA 1.015 1.005 - 1.030   pH, UA 6.0 5.0 - 7.5   Color, UA Yellow Yellow   Appearance Ur Clear Clear   Leukocytes,UA Negative Negative   Protein,UA Negative Negative/Trace   Glucose, UA Negative Negative   Ketones, UA Negative Negative   RBC, UA Negative Negative   Bilirubin, UA Negative Negative   Urobilinogen, Ur 0.2 0.2 - 1.0 mg/dL   Nitrite, UA Negative Negative  TSH  Result Value Ref Range   TSH 1.480 0.450 - 4.500 uIU/mL  Hepatitis C Antibody  Result Value Ref Range   Hep C Virus Ab <0.1 0.0 - 0.9 s/co ratio      Assessment & Plan:   Problem List Items Addressed This Visit   None    Follow up plan: No follow-ups on file.

## 2022-10-23 NOTE — Progress Notes (Signed)
Results discussed with patient during visit.

## 2022-10-24 LAB — NOVEL CORONAVIRUS, NAA: SARS-CoV-2, NAA: NOT DETECTED

## 2022-10-24 LAB — RAPID STREP SCREEN (MED CTR MEBANE ONLY): Strep Gp A Ag, IA W/Reflex: NEGATIVE

## 2022-10-24 LAB — CULTURE, GROUP A STREP: Strep A Culture: NEGATIVE

## 2022-10-24 LAB — VERITOR FLU A/B WAIVED
Influenza A: NEGATIVE
Influenza B: NEGATIVE

## 2022-10-24 NOTE — Progress Notes (Signed)
Hi Beverly Haley.  Your COVID test was negative.

## 2022-12-20 ENCOUNTER — Other Ambulatory Visit: Payer: Self-pay | Admitting: Family Medicine

## 2022-12-22 NOTE — Telephone Encounter (Signed)
Requested Prescriptions  Pending Prescriptions Disp Refills   omeprazole (PRILOSEC) 40 MG capsule [Pharmacy Med Name: OMEPRAZOLE DR 40 MG CAPSULE] 30 capsule 0    Sig: Take 1 capsule (40 mg total) by mouth daily.     Gastroenterology: Proton Pump Inhibitors Passed - 12/20/2022  8:42 AM      Passed - Valid encounter within last 12 months    Recent Outpatient Visits           2 months ago Acute non-recurrent frontal sinusitis   [place] [facility] Larae Grooms, NP   9 months ago Non-seasonal allergic rhinitis due to pollen   [facility], Dorie Rank, NP   10 months ago Dermatitis due to plants, including poison ivy, sumac, and oak   [place] [facility] Mecum, Oswaldo Conroy, PA-C   11 months ago Dermatitis due to plants, including poison ivy, sumac, and oak   [place] [employer], Oswaldo Conroy, PA-C   1 year ago Chronic bilateral low back pain with bilateral sciatica    [facility] [place], Heath, DO

## 2022-12-27 ENCOUNTER — Other Ambulatory Visit: Payer: Self-pay | Admitting: Family Medicine

## 2022-12-30 ENCOUNTER — Ambulatory Visit (INDEPENDENT_AMBULATORY_CARE_PROVIDER_SITE_OTHER): Payer: Medicaid Other | Admitting: Family Medicine

## 2022-12-30 ENCOUNTER — Other Ambulatory Visit: Payer: Self-pay | Admitting: Family Medicine

## 2022-12-30 ENCOUNTER — Encounter: Payer: Self-pay | Admitting: Family Medicine

## 2022-12-30 VITALS — BP 118/73 | HR 80 | Temp 98.2°F | Wt 151.4 lb

## 2022-12-30 DIAGNOSIS — Z0000 Encounter for general adult medical examination without abnormal findings: Secondary | ICD-10-CM

## 2022-12-30 DIAGNOSIS — Z1231 Encounter for screening mammogram for malignant neoplasm of breast: Secondary | ICD-10-CM

## 2022-12-30 DIAGNOSIS — Z1211 Encounter for screening for malignant neoplasm of colon: Secondary | ICD-10-CM

## 2022-12-30 DIAGNOSIS — K219 Gastro-esophageal reflux disease without esophagitis: Secondary | ICD-10-CM

## 2022-12-30 DIAGNOSIS — Z136 Encounter for screening for cardiovascular disorders: Secondary | ICD-10-CM | POA: Diagnosis not present

## 2022-12-30 DIAGNOSIS — Z23 Encounter for immunization: Secondary | ICD-10-CM

## 2022-12-30 DIAGNOSIS — F332 Major depressive disorder, recurrent severe without psychotic features: Secondary | ICD-10-CM

## 2022-12-30 DIAGNOSIS — E663 Overweight: Secondary | ICD-10-CM

## 2022-12-30 LAB — URINALYSIS, ROUTINE W REFLEX MICROSCOPIC
Bilirubin, UA: NEGATIVE
Glucose, UA: NEGATIVE
Leukocytes,UA: NEGATIVE
Nitrite, UA: NEGATIVE
Protein,UA: NEGATIVE
RBC, UA: NEGATIVE
Specific Gravity, UA: <1.005 — ABNORMAL LOW (ref 1.005–1.030)
Urobilinogen, Ur: 0.2 mg/dL (ref 0.2–1.0)
pH, UA: 5 (ref 5.0–7.5)

## 2022-12-30 MED ORDER — OMEPRAZOLE 40 MG PO CPDR: 40.0000 mg | DELAYED_RELEASE_CAPSULE | Freq: Two times a day (BID) | ORAL | 1 refills | Status: DC

## 2022-12-30 NOTE — Assessment & Plan Note (Signed)
Doing OK off medicine. Continue to monitor. Call with any concerns.  

## 2022-12-30 NOTE — Assessment & Plan Note (Signed)
Under good control on current regimen. Continue current regimen. Continue to monitor. Call with any concerns. Refills given. Labs drawn today.   

## 2022-12-30 NOTE — Progress Notes (Signed)
BP 118/73   Pulse 80   Temp 98.2 F (36.8 C) (Oral)   Wt 151 lb 6.4 oz (68.7 kg)   LMP 08/04/2016   SpO2 99%   BMI 25.97 kg/m    Subjective:    [name], female    DOB: 1970/10/12, 52 y.o.   MRN: [id number]  HPI: Beverly Haley is a 52 y.o. female presenting on 12/30/2022 for comprehensive medical examination. Current medical complaints include:  Concerned about weight gain. She has a history of anorexia. She notes that her weight has been stable for 3+ years. She is frustrated over her inability to lose weight.   Her acid reflux is also acting up. Needs a refill on her omeprazole.   Menopausal Symptoms: no  Depression Screen done today and results listed below:     12/30/2022    1:32 PM 10/22/2022    3:50 PM 01/27/2022    1:42 PM 01/06/2022    9:08 AM 09/19/2021    3:36 PM  Depression screen PHQ 2/9  Decreased Interest 0 2 2 1  0  Down, Depressed, Hopeless 0 1 1 0 0  PHQ - 2 Score 0 3 3 1  0  Altered sleeping 3 2 3 2  0  Tired, decreased energy 0 0 1 0 0  Change in appetite 3 0 3 0 0  Feeling bad or failure about yourself  0 0 3 0 0  Trouble concentrating 0 0 2 0 0  Moving slowly or fidgety/restless 0 0 0 0 0  Suicidal thoughts 0 0 0 0 0  PHQ-9 Score 6 5 15 3  0  Difficult doing work/chores Not difficult at all Not difficult at all Somewhat difficult Somewhat difficult     Past Medical History:  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   Status post laparoscopic assisted vaginal hysterectomy (LAVH) 06/16/2016   LAVHBSO    Surgical History:  Past Surgical History:  Procedure Laterality Date   APPENDECTOMY     BREAST SURGERY     HYSTEROSCOPY WITH NOVASURE N/A 06/16/2016   Procedure: HYSTEROSCOPY WITH NOVASURE;  Surgeon: Herold Harms, MD;  Location: [facility] ORS;  Service:  Gynecology;  Laterality: N/A;   LAPAROSCOPIC VAGINAL HYSTERECTOMY WITH SALPINGECTOMY N/A 12/08/2016   Procedure: LAPAROSCOPIC ASSISTED VAGINAL HYSTERECTOMY WITH BILATERAL SALPINGECTOMY, RIGHT OOPHERECTOMY;  Surgeon: Herold Harms, MD;  Location: [facility] ORS;  Service: Gynecology;  Laterality: N/A;   LAPAROSCOPY N/A 06/16/2016   Procedure: LAPAROSCOPY DIAGNOSTIC WITH BIOPSIES;  Surgeon: Herold Harms, MD;  Location: [facility] ORS;  Service: Gynecology;  Laterality: N/A;   PLACEMENT OF BREAST IMPLANTS      Medications:  Current Outpatient Medications on File Prior to Visit  Medication Sig   levocetirizine (XYZAL) 5 MG tablet Take 1 tablet (5 mg total) by mouth every evening. (Patient not taking: Reported on 12/30/2022)   No current facility-administered medications on file prior to visit.    Allergies:  Allergies  Allergen Reactions   Peanut-Containing Drug Products Other (See Comments)    "severe headache"    Social History:  Social History   Socioeconomic History   Marital status: Married    Spouse name: Not on file   Number of children: Not on file   Years of education:  Not on file   Highest education level: Not on file  Occupational History   Not on file  Tobacco Use   Smoking status: Never   Smokeless tobacco: Never  Vaping Use   Vaping Use: Never used  Substance and Sexual Activity   Alcohol use: Yes    Comment: socially   Drug use: No   Sexual activity: Yes    Birth control/protection: None, Surgical    Comment: Spouse had vasectomy  Other Topics Concern   Not on file  Social History Narrative   Not on file   Social Determinants of Health   Financial Resource Strain: Not on file  Food Insecurity: Not on file  Transportation Needs: Not on file  Physical Activity: Not on file  Stress: Not on file  Social Connections: Not on file  Intimate Partner Violence: Not on file   Social History   Tobacco Use  Smoking Status Never  Smokeless Tobacco Never    Social History   Substance and Sexual Activity  Alcohol Use Yes   Comment: socially    Family History:  Family History  Problem Relation Age of Onset   Migraines Mother    Hyperlipidemia Father    Hypertension Father    Breast cancer Neg Hx    Ovarian cancer Neg Hx    Colon cancer Neg Hx    Diabetes Neg Hx     Past medical history, surgical history, medications, allergies, family history and social history reviewed with patient today and changes made to appropriate areas of the chart.   Review of Systems  Constitutional: Negative.   HENT: Negative.    Eyes: Negative.   Respiratory: Negative.    Cardiovascular: Negative.   Gastrointestinal:  Positive for heartburn. Negative for abdominal pain, blood in stool, constipation, diarrhea, melena, nausea and vomiting.  Genitourinary: Negative.   Musculoskeletal: Negative.   Skin: Negative.   Neurological:  Positive for tingling (R index finger after trauma). Negative for dizziness, tremors, sensory change, speech change, focal weakness, seizures, loss of consciousness, weakness and headaches.  Endo/Heme/Allergies: Negative.   Psychiatric/Behavioral: Negative.     All other ROS negative except what is listed above and in the HPI.      Objective:    BP 118/73   Pulse 80   Temp 98.2 F (36.8 C) (Oral)   Wt 151 lb 6.4 oz (68.7 kg)   LMP 08/04/2016   SpO2 99%   BMI 25.97 kg/m   Wt Readings from Last 3 Encounters:  12/30/22 151 lb 6.4 oz (68.7 kg)  10/22/22 153 lb 11 oz (69.7 kg)  01/27/22 154 lb 3.2 oz (69.9 kg)    Physical Exam Vitals and nursing note reviewed.  Constitutional:      General: She is not in acute distress.    Appearance: Normal appearance. She is not ill-appearing, toxic-appearing or diaphoretic.  HENT:     Head: Normocephalic and atraumatic.     Right Ear: Tympanic membrane, ear canal and external ear normal. There is no impacted cerumen.     Left Ear: Tympanic membrane, ear canal and external ear  normal. There is no impacted cerumen.     Nose: Nose normal. No congestion or rhinorrhea.     Mouth/Throat:     Mouth: Mucous membranes are moist.     Pharynx: Oropharynx is clear. No oropharyngeal exudate or posterior oropharyngeal erythema.  Eyes:     General: No scleral icterus.       Right eye: No discharge.  Left eye: No discharge.     Extraocular Movements: Extraocular movements intact.     Conjunctiva/sclera: Conjunctivae normal.     Pupils: Pupils are equal, round, and reactive to light.  Neck:     Vascular: No carotid bruit.  Cardiovascular:     Rate and Rhythm: Normal rate and regular rhythm.     Pulses: Normal pulses.     Heart sounds: No murmur heard.    No friction rub. No gallop.  Pulmonary:     Effort: Pulmonary effort is normal. No respiratory distress.     Breath sounds: Normal breath sounds. No stridor. No wheezing, rhonchi or rales.  Chest:     Chest wall: No tenderness.  Abdominal:     General: Abdomen is flat. Bowel sounds are normal. There is no distension.     Palpations: Abdomen is soft. There is no mass.     Tenderness: There is no abdominal tenderness. There is no right CVA tenderness, left CVA tenderness, guarding or rebound.     Hernia: No hernia is present.  Genitourinary:    Comments: Breast and pelvic exams deferred with shared decision making Musculoskeletal:        General: No swelling, tenderness, deformity or signs of injury.     Cervical back: Normal range of motion and neck supple. No rigidity. No muscular tenderness.     Right lower leg: No edema.     Left lower leg: No edema.  Lymphadenopathy:     Cervical: No cervical adenopathy.  Skin:    General: Skin is warm and dry.     Capillary Refill: Capillary refill takes less than 2 seconds.     Coloration: Skin is not jaundiced or pale.     Findings: No bruising, erythema, lesion or rash.  Neurological:     General: No focal deficit present.     Mental Status: She is alert and  oriented to person, place, and time. Mental status is at baseline.     Cranial Nerves: No cranial nerve deficit.     Sensory: No sensory deficit.     Motor: No weakness.     Coordination: Coordination normal.     Gait: Gait normal.     Deep Tendon Reflexes: Reflexes normal.  Psychiatric:        Mood and Affect: Mood normal.        Behavior: Behavior normal.        Thought Content: Thought content normal.        Judgment: Judgment normal.     Results for orders placed or performed in visit on 10/22/22  Rapid Strep Screen ([facility] ONLY)   Specimen: Other   Other  Result Value Ref Range   Strep Gp A Ag, IA W/Reflex Negative Negative  Novel Coronavirus, NAA ([facility])   Specimen: Nasopharyngeal(NP) swabs in vial transport medium  Result Value Ref Range   SARS-CoV-2, NAA Not Detected Not Detected  Culture, Group A Strep   Other  Result Value Ref Range   Strep A Culture Negative   Veritor Flu A/B Waived  Result Value Ref Range   Influenza A Negative Negative   Influenza B Negative Negative      Assessment & Plan:   Problem List Items Addressed This Visit       Digestive   Gastroesophageal reflux disease    Under good control on current regimen. Continue current regimen. Continue to monitor. Call with any concerns. Refills given. Labs drawn today.  Relevant Medications   omeprazole (PRILOSEC) 40 MG capsule     Other   Major depression, recurrent (HCC)    Doing OK off medicine. Continue to monitor. Call with any concerns.       Other Visit Diagnoses     Routine general medical examination at a health care facility    -  Primary   Vaccines updated. Screening labs checked today. Pap N/A. Mammo and cologuard ordered today. Contiue diet and exercise. Call with any concerns.   Relevant Orders   CBC with Differential/Platelet   Comprehensive metabolic panel   Lipid Panel w/o Chol/HDL Ratio   Urinalysis, Routine w reflex microscopic   TSH   Encounter for  screening mammogram for malignant neoplasm of breast       Mammogram ordered today.   Screening for colon cancer       Cologuard ordered today.   Relevant Orders   Cologuard   Overweight       Offered referral to nutrition for very mild overweight. She declined. Work on diet and exercise. Call with any concerns.        Follow up plan: Return in about 1 year (around 12/30/2023) for physical.   LABORATORY TESTING:  - Pap smear: not applicable  IMMUNIZATIONS:   - Tdap: Tetanus vaccination status reviewed: Tdap vaccination indicated and given today. - Influenza: Postponed to flu season - Pneumovax: Not applicable - Prevnar: Not applicable - COVID: Up to date - HPV: Not applicable - Shingrix vaccine: Administered today  SCREENING: -Mammogram: Ordered today  - Colonoscopy: Ordered today   PATIENT COUNSELING:   Advised to take 1 mg of folate supplement per day if capable of pregnancy.   Sexuality: Discussed sexually transmitted diseases, partner selection, use of condoms, avoidance of unintended pregnancy  and contraceptive alternatives.   Advised to avoid cigarette smoking.  I discussed with the patient that most people either abstain from alcohol or drink within safe limits (<=14/week and <=4 drinks/occasion for males, <=7/weeks and <= 3 drinks/occasion for females) and that the risk for alcohol disorders and other health effects rises proportionally with the number of drinks per week and how often a drinker exceeds daily limits.  Discussed cessation/primary prevention of drug use and availability of treatment for abuse.   Diet: Encouraged to adjust caloric intake to maintain  or achieve ideal body weight, to reduce intake of dietary saturated fat and total fat, to limit sodium intake by avoiding high sodium foods and not adding table salt, and to maintain adequate dietary potassium and calcium preferably from fresh fruits, vegetables, and low-fat dairy products.    stressed  the importance of regular exercise  Injury prevention: Discussed safety belts, safety helmets, smoke detector, smoking near bedding or upholstery.   Dental health: Discussed importance of regular tooth brushing, flossing, and dental visits.    NEXT PREVENTATIVE PHYSICAL DUE IN 1 YEAR. Return in about 1 year (around 12/30/2023) for physical.

## 2022-12-30 NOTE — Telephone Encounter (Signed)
Requested Prescriptions  Pending Prescriptions Disp Refills   omeprazole (PRILOSEC) 40 MG capsule [Pharmacy Med Name: OMEPRAZOLE DR 40 MG CAPSULE] 90 capsule 3    Sig: Take 1 capsule (40 mg total) by mouth daily.     Gastroenterology: Proton Pump Inhibitors Passed - 12/27/2022  8:59 AM      Passed - Valid encounter within last 12 months    Recent Outpatient Visits           2 months ago Acute non-recurrent frontal sinusitis   [place] [facility] Larae Grooms, NP   9 months ago Non-seasonal allergic rhinitis due to pollen   [facility], Dorie Rank, NP   11 months ago Dermatitis due to plants, including poison ivy, sumac, and oak   [place] [facility] Mecum, Oswaldo Conroy, PA-C   11 months ago Dermatitis due to plants, including poison ivy, sumac, and oak   [place] [employer], Oswaldo Conroy, PA-C   1 year ago Chronic bilateral low back pain with bilateral sciatica   [facility], Oralia Rud, DO       Future Appointments             Today Dorcas Carrow, DO [place] [facility]

## 2022-12-30 NOTE — Patient Instructions (Signed)
Patient is scheduled for Mammogram June 12th at 1:20 PM at [facility]. Patient is NOT to wear any perfumes, deodorant or lotions for the appointment. If patient has any issues regarding her scheduled appointment. Patient is to give their facility a call directly [phone].

## 2022-12-31 LAB — COMPREHENSIVE METABOLIC PANEL
ALT: 19 IU/L (ref 0–32)
AST: 25 IU/L (ref 0–40)
Albumin/Globulin Ratio: 2.2 (ref 1.2–2.2)
Albumin: 3.8 g/dL (ref 3.8–4.9)
Alkaline Phosphatase: 74 IU/L (ref 44–121)
BUN/Creatinine Ratio: 12 (ref 9–23)
BUN: 10 mg/dL (ref 6–24)
Bilirubin Total: 0.3 mg/dL (ref 0.0–1.2)
CO2: 19 mmol/L — ABNORMAL LOW (ref 20–29)
Calcium: 9.1 mg/dL (ref 8.7–10.2)
Chloride: 98 mmol/L (ref 96–106)
Creatinine, Ser: 0.84 mg/dL (ref 0.57–1.00)
Globulin, Total: 1.7 g/dL (ref 1.5–4.5)
Glucose: 69 mg/dL — ABNORMAL LOW (ref 70–99)
Potassium: 4 mmol/L (ref 3.5–5.2)
Sodium: 135 mmol/L (ref 134–144)
Total Protein: 5.5 g/dL — ABNORMAL LOW (ref 6.0–8.5)
eGFR: 84 mL/min/{1.73_m2} (ref >59)

## 2022-12-31 LAB — CBC WITH DIFFERENTIAL/PLATELET
Basophils Absolute: 0 10*3/uL (ref 0.0–0.2)
Basos: 0 %
EOS (ABSOLUTE): 0.1 10*3/uL (ref 0.0–0.4)
Eos: 1 %
Hematocrit: 35.3 % (ref 34.0–46.6)
Hemoglobin: 11.6 g/dL (ref 11.1–15.9)
Immature Grans (Abs): 0 10*3/uL (ref 0.0–0.1)
Immature Granulocytes: 0 %
Lymphocytes Absolute: 1.5 10*3/uL (ref 0.7–3.1)
Lymphs: 18 %
MCH: 27.8 pg (ref 26.6–33.0)
MCHC: 32.9 g/dL (ref 31.5–35.7)
MCV: 85 fL (ref 79–97)
Monocytes Absolute: 0.5 10*3/uL (ref 0.1–0.9)
Monocytes: 6 %
Neutrophils Absolute: 5.9 10*3/uL (ref 1.4–7.0)
Neutrophils: 75 %
Platelets: 335 10*3/uL (ref 150–450)
RBC: 4.17 x10E6/uL (ref 3.77–5.28)
RDW: 13.8 % (ref 11.7–15.4)
WBC: 8 10*3/uL (ref 3.4–10.8)

## 2022-12-31 LAB — TSH: TSH: 0.917 u[IU]/mL (ref 0.450–4.500)

## 2022-12-31 LAB — LIPID PANEL W/O CHOL/HDL RATIO
Cholesterol, Total: 201 mg/dL — ABNORMAL HIGH (ref 100–199)
HDL: 71 mg/dL (ref >39)
LDL Chol Calc (NIH): 121 mg/dL — ABNORMAL HIGH (ref 0–99)
Triglycerides: 47 mg/dL (ref 0–149)
VLDL Cholesterol Cal: 9 mg/dL (ref 5–40)

## 2023-01-01 ENCOUNTER — Telehealth: Payer: Self-pay | Admitting: Family Medicine

## 2023-01-01 NOTE — Telephone Encounter (Signed)
Patient called for lab results. Results given. Patient states the reason her sugar being low and some ketones found in her urine is due to her fasting and not eating any sugar. Patient states that she will increase food intake a little, but she is trying to lose some weight.

## 2023-01-06 ENCOUNTER — Encounter: Payer: Medicaid Other | Admitting: Family Medicine

## 2023-01-13 ENCOUNTER — Encounter: Payer: Self-pay | Admitting: Family Medicine

## 2023-01-14 ENCOUNTER — Ambulatory Visit: Payer: Medicaid Other

## 2023-02-04 ENCOUNTER — Telehealth: Payer: Self-pay

## 2023-02-04 NOTE — Telephone Encounter (Signed)
Chart review completed for patient. Patient is due for screening mammogram. Mychart message sent to patient to inquire about scheduling mammogram.  Weslynn Ke, Population Health Specialist.  

## 2023-03-11 ENCOUNTER — Telehealth: Payer: Self-pay

## 2023-03-11 NOTE — Telephone Encounter (Signed)
-----   Message from Olevia Perches sent at 03/09/2023  4:06 PM EDT ----- Please get scheduled for mammo

## 2023-03-11 NOTE — Telephone Encounter (Signed)
Called Beverly Haley and scheduled the patient's mammogram for 03/24/23 at 1:20 pm.   Called and notified the patient of the appointment date and time.

## 2023-05-07 ENCOUNTER — Ambulatory Visit
Admission: RE | Admit: 2023-05-07 | Discharge: 2023-05-07 | Disposition: A | Discharge status: Home / Self Care | Payer: Medicaid Other | Source: Ambulatory Visit | Attending: Family Medicine | Admitting: Family Medicine

## 2023-05-07 DIAGNOSIS — Z1231 Encounter for screening mammogram for malignant neoplasm of breast: Secondary | ICD-10-CM | POA: Insufficient documentation

## 2023-05-08 ENCOUNTER — Other Ambulatory Visit: Payer: Self-pay | Admitting: Family Medicine

## 2023-05-08 DIAGNOSIS — Z1212 Encounter for screening for malignant neoplasm of rectum: Secondary | ICD-10-CM

## 2023-05-08 DIAGNOSIS — Z1211 Encounter for screening for malignant neoplasm of colon: Secondary | ICD-10-CM

## 2023-06-30 ENCOUNTER — Telehealth (INDEPENDENT_AMBULATORY_CARE_PROVIDER_SITE_OTHER): Payer: Medicaid Other | Admitting: Nurse Practitioner

## 2023-06-30 ENCOUNTER — Encounter: Payer: Self-pay | Admitting: Nurse Practitioner

## 2023-06-30 ENCOUNTER — Ambulatory Visit: Payer: Self-pay

## 2023-06-30 DIAGNOSIS — F4310 Post-traumatic stress disorder, unspecified: Secondary | ICD-10-CM

## 2023-06-30 MED ORDER — AMLODIPINE BESYLATE 2.5 MG PO TABS: 2.5000 mg | ORAL_TABLET | Freq: Every day | ORAL | 4 refills | Status: DC

## 2023-06-30 NOTE — Progress Notes (Signed)
LMP 08/04/2016    Subjective:    [name], female    DOB: Mar 20, 1971, 52 y.o.   MRN: [id number]  HPI: Beverly Haley is a 52 y.o. female  Chief Complaint  Patient presents with   Anxiety   Virtual Visit via Video Note  I connected with Beverly Haley on 07-11-23 at  1:20 PM EST by a video enabled telemedicine application and verified that I am speaking with the correct person using two identifiers.  Location: Patient: home Provider: work   I discussed the limitations of evaluation and management by telemedicine and the availability of in person appointments. The patient expressed understanding and agreed to proceed.  I discussed the assessment and treatment plan with the patient. The patient was provided an opportunity to ask questions and all were answered. The patient agreed with the plan and demonstrated an understanding of the instructions.   The patient was advised to call back or seek an in-person evaluation if the symptoms worsen or if the condition fails to improve as anticipated.  I provided 21 minutes of non-face-to-face time during this encounter.   Marjie Skiff, NP   ANXIETY/STRESS + CHEST ISSUES Many years ago went through issues with divorce and custody battle.  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  Has been off of it for a long while.  On review appears to have taken Amlodipine for this.  She would like to go back on this as having lots of life stressors at present and symptoms are returning.  She does not want to start medication for mood. Duration:exacerbated Anxious mood: yes  Excessive worrying: yes Irritability: yes  Sweating: no Nausea: no Palpitations:yes Hyperventilation: no Panic attacks: occasional Agoraphobia: no  Obscessions/compulsions: no Depressed mood: no     07/11/2023    1:40 PM 12/30/2022    1:32 PM 10/22/2022    3:50 PM 01/27/2022    1:42 PM 01/06/2022    9:08 AM  Depression screen PHQ 2/9  Decreased Interest 0 0 2 2 1   Down, Depressed, Hopeless 0 0 1 1 0  PHQ - 2 Score 0 0 3 3 1   Altered sleeping 3 3 2 3 2   Tired, decreased energy 2 0 0 1 0  Change in appetite 0 3 0 3 0  Feeling bad or failure about yourself  0 0 0 3 0  Trouble concentrating 1 0 0 2 0  Moving slowly or fidgety/restless 0 0 0 0 0  Suicidal thoughts 0 0 0 0 0  PHQ-9 Score 6 6 5 15 3   Difficult doing work/chores  Not difficult at all Not difficult at all Somewhat difficult Somewhat difficult  Anhedonia: no Weight changes: no Insomnia: has weird sleep pattern Hypersomnia: no Fatigue/loss of energy: no Feelings of worthlessness: no Feelings of guilt: no Impaired concentration/indecisiveness: no Suicidal ideations: no  Crying spells: no Recent Stressors/Life Changes: yes   Relationship problems: no   Family stress: yes     Financial stress: no    Job stress: no    Recent death/loss: no     Jul 11, 2023    1:41 PM 12/30/2022    1:33 PM 10/22/2022    3:50 PM 01/27/2022    1:42 PM  GAD 7 : Generalized Anxiety Score  Nervous, Anxious, on Edge  1 0 0 0  Control/stop worrying 1 0 0 0  Worry too much - different things 0 0 0 3  Trouble relaxing 1 0 0 3  Restless 0 1 0 3  Easily annoyed or irritable 1 0 0 2  Afraid - awful might happen 0 0 0 1  Total GAD 7 Score 4 1 0 12  Anxiety Difficulty Somewhat difficult Not difficult at all Not difficult at all Somewhat difficult    Relevant past medical, surgical, family and social history reviewed and updated as indicated. Interim medical history since our last visit reviewed. Allergies and medications reviewed and updated.  Review of Systems  Constitutional:  Negative for activity change, appetite change, diaphoresis, fatigue and fever.  Respiratory:  Positive for chest tightness. Negative for cough, shortness of breath and  wheezing.   Cardiovascular:  Negative for chest pain, palpitations and leg swelling.  Neurological: Negative.   Psychiatric/Behavioral:  Negative for decreased concentration, self-injury, sleep disturbance and suicidal ideas. The patient is nervous/anxious.     Per HPI unless specifically indicated above     Objective:    LMP 08/04/2016   Wt Readings from Last 3 Encounters:  12/30/22 151 lb 6.4 oz (68.7 kg)  10/22/22 153 lb 11 oz (69.7 kg)  01/27/22 154 lb 3.2 oz (69.9 kg)    Physical Exam Vitals and nursing note reviewed.  Constitutional:      General: She is awake. She is not in acute distress.    Appearance: She is well-developed. She is not ill-appearing.  HENT:     Head: Normocephalic.     Right Ear: Hearing normal.     Left Ear: Hearing normal.  Eyes:     General: Lids are normal.        Right eye: No discharge.        Left eye: No discharge.     Conjunctiva/sclera: Conjunctivae normal.  Pulmonary:     Effort: Pulmonary effort is normal. No accessory muscle usage or respiratory distress.  Musculoskeletal:     Cervical back: Normal range of motion.  Neurological:     Mental Status: She is alert and oriented to person, place, and time.  Psychiatric:        Attention and Perception: Attention normal.        Mood and Affect: Mood normal.        Behavior: Behavior normal. Behavior is cooperative.        Thought Content: Thought content normal.        Judgment: Judgment normal.     Results for orders placed or performed in visit on 12/30/22  CBC with Differential/Platelet  Result Value Ref Range   WBC 8.0 3.4 - 10.8 x10E3/uL   RBC 4.17 3.77 - 5.28 x10E6/uL   Hemoglobin 11.6 11.1 - 15.9 g/dL   Hematocrit 16.1 09.6 - 46.6 %   MCV 85 79 - 97 fL   MCH 27.8 26.6 - 33.0 pg   MCHC 32.9 31.5 - 35.7 g/dL   RDW 04.5 40.9 - 81.1 %   Platelets 335 150 - 450 x10E3/uL   Neutrophils 75 Not Estab. %   Lymphs 18 Not Estab. %   Monocytes 6 Not Estab. %   Eos 1 Not Estab. %    Basos 0 Not Estab. %   Neutrophils Absolute 5.9 1.4 - 7.0 x10E3/uL   Lymphocytes Absolute 1.5 0.7 - 3.1 x10E3/uL   Monocytes Absolute 0.5 0.1 - 0.9 x10E3/uL   EOS (ABSOLUTE) 0.1 0.0 -  0.4 x10E3/uL   Basophils Absolute 0.0 0.0 - 0.2 x10E3/uL   Immature Granulocytes 0 Not Estab. %   Immature Grans (Abs) 0.0 0.0 - 0.1 x10E3/uL  Comprehensive metabolic panel  Result Value Ref Range   Glucose 69 (L) 70 - 99 mg/dL   BUN 10 6 - 24 mg/dL   Creatinine, Ser 8.29 0.57 - 1.00 mg/dL   eGFR 84 >56 OZ/HYQ/6.57   BUN/Creatinine Ratio 12 9 - 23   Sodium 135 134 - 144 mmol/L   Potassium 4.0 3.5 - 5.2 mmol/L   Chloride 98 96 - 106 mmol/L   CO2 19 (L) 20 - 29 mmol/L   Calcium 9.1 8.7 - 10.2 mg/dL   Total Protein 5.5 (L) 6.0 - 8.5 g/dL   Albumin 3.8 3.8 - 4.9 g/dL   Globulin, Total 1.7 1.5 - 4.5 g/dL   Albumin/Globulin Ratio 2.2 1.2 - 2.2   Bilirubin Total 0.3 0.0 - 1.2 mg/dL   Alkaline Phosphatase 74 44 - 121 IU/L   AST 25 0 - 40 IU/L   ALT 19 0 - 32 IU/L  Lipid Panel w/o Chol/HDL Ratio  Result Value Ref Range   Cholesterol, Total 201 (H) 100 - 199 mg/dL   Triglycerides 47 0 - 149 mg/dL   HDL 71 >84 mg/dL   VLDL Cholesterol Cal 9 5 - 40 mg/dL   LDL Chol Calc (NIH) 696 (H) 0 - 99 mg/dL  Urinalysis, Routine w reflex microscopic  Result Value Ref Range   Specific Gravity, UA <1.005 (L) 1.005 - 1.030   pH, UA 5.0 5.0 - 7.5   Color, UA Yellow Yellow   Appearance Ur Clear Clear   Leukocytes,UA Negative Negative   Protein,UA Negative Negative/Trace   Glucose, UA Negative Negative   Ketones, UA 2+ (A) Negative   RBC, UA Negative Negative   Bilirubin, UA Negative Negative   Urobilinogen, Ur 0.2 0.2 - 1.0 mg/dL   Nitrite, UA Negative Negative   Microscopic Examination Comment   TSH  Result Value Ref Range   TSH 0.917 0.450 - 4.500 uIU/mL      Assessment & Plan:   Problem List Items Addressed This Visit       Other   PTSD (post-traumatic stress disorder) - Primary    Currently  exacerbated by life stressors, 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  Would like to restart this.  Amlodipine 2.5 MG sent in, this was previous dose in 2018 on review.  Educated her on this.  Advised her not to take her mother's NTG.  Does not want medication for mood.  Denies SI/HI.        Follow up plan: Return in about 6 months (around 12/28/2023) for Needs physical scheduled for after 12/30/22 + recommend 4 week follow-up with Dr. Shela Commons for anxiety.

## 2023-06-30 NOTE — Telephone Encounter (Signed)
  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 Pt unsure of name of medications. Pt seen cardiology years ago as well and said this wasn't a heart attack. Pt is wanting medications refilled. Advised since been years since sx to do OV. Pt has no transportation so scheduled VV today at 1320 with Corrie Dandy, NP.  Reason for Disposition  [1] Chest pain from known angina comes and goes AND [2] is NOT happening more often (increasing in frequency) or getting worse (increasing in severity)  Answer Assessment - Initial Assessment Questions 1. LOCATION: "Where does it hurt?"       Center of chest  3. ONSET: "When did the chest pain begin?" (Minutes, hours or days)      Yesterday  4. PATTERN: "Does the pain come and go, or has it been constant since it started?"  "Does it get worse with exertion?"      Constant  6. SEVERITY: "How bad is the pain?"  (e.g., Scale 1-10; mild, moderate, or severe)    - MILD (1-3): doesn't interfere with normal activities     - MODERATE (4-7): interferes with normal activities or awakens from sleep    - SEVERE (8-10): excruciating pain, unable to do any normal activities       8 7. CARDIAC RISK FACTORS: "Do you have any history of heart problems or risk factors for heart disease?" (e.g., angina, prior heart attack; diabetes, high blood pressure, high cholesterol, smoker, or strong  family history of heart disease)     Hx of heart broke syndrome  10. OTHER SYMPTOMS: "Do you have any other symptoms?" (e.g., dizziness, nausea, vomiting, sweating, fever, difficulty breathing, cough)       Hurts to breathe at times, anxious  Protocols used: Chest Pain-A-AH

## 2023-06-30 NOTE — Patient Instructions (Signed)
Managing Anxiety, Adult  After being diagnosed with anxiety, you may be relieved to know why you have felt or behaved a certain way. You may also feel overwhelmed about the treatment ahead and what it will mean for your life. With care and support, you can manage your anxiety.  How to manage lifestyle changes  Understanding the difference between stress and anxiety  Although stress can play a role in anxiety, it is not the same as anxiety. Stress is your body's reaction to life changes and events, both good and bad. Stress is often caused by something external, such as a deadline, test, or competition. It normally goes away after the event has ended and will last just a few hours. But, stress can be ongoing and can lead to more than just stress.  Anxiety is caused by something internal, such as imagining a terrible outcome or worrying that something will go wrong that will greatly upset you. Anxiety often does not go away even after the event is over, and it can become a long-term (chronic) worry.  Lowering stress and anxiety    Talk with your health care provider or a counselor to learn more about lowering anxiety and stress. They may suggest tension-reduction techniques, such as:  Music. Spend time creating or listening to music that you enjoy and that inspires you.  Mindfulness-based meditation. Practice being aware of your normal breaths while not trying to control your breathing. It can be done while sitting or walking.  Centering prayer. Focus on a word, phrase, or sacred image that means something to you and brings you peace.  Deep breathing. Expand your stomach and inhale slowly through your nose. Hold your breath for 3-5 seconds. Then breathe out slowly, letting your stomach muscles relax.  Self-talk. Learn to notice and spot thought patterns that lead to anxiety reactions. Change those patterns to thoughts that feel peaceful.  Muscle relaxation. Take time to tense muscles and then relax them.  Choose a  tension-reduction technique that fits your lifestyle and personality. These techniques take time and practice. Set aside 5-15 minutes a day to do them. Specialized therapists can offer counseling and training in these techniques. The training to help with anxiety may be covered by some insurance plans.  Other things you can do to manage stress and anxiety include:  Keeping a stress diary. This can help you learn what triggers your reaction and then learn ways to manage your response.  Thinking about how you react to certain situations. You may not be able to control everything, but you can control your response.  Making time for activities that help you relax and not feeling guilty about spending your time in this way.  Doing visual imagery. This involves imagining or creating mental pictures to help you relax.  Practicing yoga. Through yoga poses, you can lower tension and relax.     Medicines  Medicines for anxiety include:  Antidepressant medicines. These are usually prescribed for long-term daily control.  Anti-anxiety medicines. These may be added in severe cases, especially when panic attacks occur.  When used together, medicines, psychotherapy, and tension-reduction techniques may be the most effective treatment.  Relationships  Relationships can play a big part in helping you recover. Spend more time connecting with trusted friends and family members. Think about going to couples counseling if you have a partner, taking family education classes, or going to family therapy. Therapy can help you and others better understand your anxiety.  How to recognize changes in  your anxiety  Everyone responds differently to treatment for anxiety. Recovery from anxiety happens when symptoms lessen and stop interfering with your daily life at home or work. This may mean that you will start to:  Have better concentration and focus. Worry will interfere less in your daily thinking.  Sleep better.  Be less irritable.  Have  more energy.  Have improved memory.  Try to recognize when your condition is getting worse. Contact your provider if your symptoms interfere with home or work and you feel like your condition is not improving.  Follow these instructions at home:  Activity  Exercise. Adults should:  Exercise for at least 150 minutes each week. The exercise should increase your heart rate and make you sweat (moderate-intensity exercise).  Do strengthening exercises at least twice a week.  Get the right amount and quality of sleep. Most adults need 7-9 hours of sleep each night.  Lifestyle    Eat a healthy diet that includes plenty of vegetables, fruits, whole grains, low-fat dairy products, and lean protein.  Do not eat a lot of foods that are high in fats, added sugars, or salt (sodium).  Make choices that simplify your life.  Do not use any products that contain nicotine or tobacco. These products include cigarettes, chewing tobacco, and vaping devices, such as e-cigarettes. If you need help quitting, ask your provider.  Avoid caffeine, alcohol, and certain over-the-counter cold medicines. These may make you feel worse. Ask your pharmacist which medicines to avoid.  General instructions  Take over-the-counter and prescription medicines only as told by your provider.  Keep all follow-up visits. This is to make sure you are managing your anxiety well or if you need more support.  Where to find support  You can get help and support from:  Self-help groups.  Online and [employer].  A trusted spiritual leader.  Couples counseling.  Family education classes.  Family therapy.  Where to find more information  You may find that joining a support group helps you deal with your anxiety. The following sources can help you find counselors or support groups near you:  Mental Health America: mentalhealthamerica.net  Anxiety and Depression Association of [place] (ADAA): adaa.org  [employer] on Mental Illness (NAMI):  nami.org  Contact a health care provider if:  You have a hard time staying focused or finishing tasks.  You spend many hours a day feeling worried about everyday life.  You are very tired because you cannot stop worrying.  You start to have headaches or often feel tense.  You have chronic nausea or diarrhea.  Get help right away if:  Your heart feels like it is racing.  You have shortness of breath.  You have thoughts of hurting yourself or others.  Get help right away if you feel like you may hurt yourself or others, or have thoughts about taking your own life. Go to your nearest emergency room or:  Call 911.  Call the National Suicide Prevention Lifeline at 760-100-0655 or 988. This is open 24 hours a day.  Text the Crisis Text Line at 236-300-7747.  This information is not intended to replace advice given to you by your health care provider. Make sure you discuss any questions you have with your health care provider.  Document Revised: 04/29/2022 Document Reviewed: 11/11/2020  Elsevier Patient Education  2024 [employer].

## 2023-06-30 NOTE — Assessment & Plan Note (Signed)
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  Would like to restart this.  Amlodipine 2.5 MG sent in, this was previous dose in 2018 on review.  Educated her on this.  Advised her not to take her mother's NTG.  Does not want medication for mood.  Denies SI/HI.

## 2023-07-06 NOTE — Progress Notes (Signed)
Appointment has been made

## 2023-08-04 ENCOUNTER — Ambulatory Visit: Payer: Medicaid Other | Admitting: Family Medicine

## 2023-08-04 ENCOUNTER — Encounter: Payer: Self-pay | Admitting: Family Medicine

## 2023-08-04 VITALS — BP 113/77 | HR 73 | Wt 139.0 lb

## 2023-08-04 DIAGNOSIS — Z23 Encounter for immunization: Secondary | ICD-10-CM | POA: Diagnosis not present

## 2023-08-04 DIAGNOSIS — F4310 Post-traumatic stress disorder, unspecified: Secondary | ICD-10-CM | POA: Diagnosis not present

## 2023-08-04 DIAGNOSIS — F5000 Anorexia nervosa, unspecified: Secondary | ICD-10-CM | POA: Diagnosis not present

## 2023-08-04 DIAGNOSIS — F332 Major depressive disorder, recurrent severe without psychotic features: Secondary | ICD-10-CM

## 2023-08-04 MED ORDER — OMEPRAZOLE 20 MG PO CPDR: 20.0000 mg | DELAYED_RELEASE_CAPSULE | Freq: Every day | ORAL | 3 refills | Status: DC

## 2023-08-04 MED ORDER — ARIPIPRAZOLE 2 MG PO TABS
2.0000 mg | ORAL_TABLET | Freq: Every day | ORAL | 3 refills | Status: DC
Start: 2023-08-04 — End: 2023-09-18

## 2023-08-04 NOTE — Progress Notes (Signed)
 BP 113/77   Pulse 73   Wt 139 lb (63 kg)   LMP 08/04/2016   SpO2 99%   BMI 23.85 kg/m    Subjective:    [name], female    DOB: 1970/10/16, 52 y.o.   MRN: [id number]  HPI: Beverly Haley is a 51 y.o. female  Chief Complaint  Patient presents with   Anxiety    Patient says she is starting lose her you know what over the smallest things. Patient says she is currently not taking any medications at the time. Patient says she stopped her previous treatment cold turkey. Patient says the previous treatment caused her to gain weight. Patient says she has night terrors, agitated, anger and irritable. Patient says her eating habits suck, as she does not have an appetite. Patient has loss her son since her last in office appointment.    ANXIETY/STRESS- not doing well, has been having issues with her mood, having a lot of mood swings, not feeling well.  Duration: chronic Status:exacerbated Anxious mood: yes  Excessive worrying: yes Irritability: yes  Sweating: no Nausea: no Palpitations:no Hyperventilation: no Panic attacks: yes Agoraphobia: no  Obscessions/compulsions: no Depressed mood: yes    08/04/2023   10:52 AM 06/30/2023    1:40 PM 12/30/2022    1:32 PM 10/22/2022    3:50 PM 01/27/2022    1:42 PM  Depression screen PHQ 2/9  Decreased Interest 2 0 0 2 2  Down, Depressed, Hopeless 3 0 0 1 1  PHQ - 2 Score 5 0 0 3 3  Altered sleeping 3 3 3 2 3   Tired, decreased energy 0 2 0 0 1  Change in appetite 3 0 3 0 3  Feeling bad or failure about yourself  3 0 0 0 3  Trouble concentrating 3 1 0 0 2  Moving slowly or fidgety/restless 3 0 0 0 0  Suicidal thoughts 0 0 0 0 0  PHQ-9 Score 20 6 6 5 15   Difficult doing work/chores Very difficult  Not difficult at all Not difficult at all Somewhat difficult      08/04/2023   10:52 AM 06/30/2023    1:41 PM 12/30/2022    1:33 PM 10/22/2022    3:50 PM  GAD 7 : Generalized Anxiety Score  Nervous, Anxious, on  Edge 3 1 0 0  Control/stop worrying 3 1 0 0  Worry too much - different things 3 0 0 0  Trouble relaxing 3 1 0 0  Restless 3 0 1 0  Easily annoyed or irritable 3 1 0 0  Afraid - awful might happen 2 0 0 0  Total GAD 7 Score 20 4 1  0  Anxiety Difficulty Very difficult Somewhat difficult Not difficult at all Not difficult at all   Anhedonia: no Weight changes: no Insomnia: no   Hypersomnia: no Fatigue/loss of energy: yes Feelings of worthlessness: yes Feelings of guilt: yes Impaired concentration/indecisiveness: yes Suicidal ideations: no  Crying spells: yes Recent Stressors/Life Changes: yes   Relationship problems: no   Family stress: no     Financial stress: no    Job stress: no    Recent death/loss: no  Relevant past medical, surgical, family and social history reviewed and updated as indicated. Interim medical history since our last visit reviewed. Allergies and medications reviewed and updated.  Review of Systems  Constitutional: Negative.   Respiratory: Negative.    Cardiovascular: Negative.   Musculoskeletal: Negative.   Skin:  Negative.   Psychiatric/Behavioral:  Positive for agitation and dysphoric mood. Negative for behavioral problems, confusion, decreased concentration, hallucinations, self-injury, sleep disturbance and suicidal ideas. The patient is nervous/anxious. The patient is not hyperactive.     Per HPI unless specifically indicated above     Objective:    BP 113/77   Pulse 73   Wt 139 lb (63 kg)   LMP 08/04/2016   SpO2 99%   BMI 23.85 kg/m   Wt Readings from Last 3 Encounters:  08/04/23 139 lb (63 kg)  12/30/22 151 lb 6.4 oz (68.7 kg)  10/22/22 153 lb 11 oz (69.7 kg)    Physical Exam Vitals and nursing note reviewed.  Constitutional:      General: She is not in acute distress.    Appearance: Normal appearance. She is not ill-appearing, toxic-appearing or diaphoretic.  HENT:     Head: Normocephalic and atraumatic.     Right Ear: External  ear normal.     Left Ear: External ear normal.     Nose: Nose normal.     Mouth/Throat:     Mouth: Mucous membranes are moist.     Pharynx: Oropharynx is clear.  Eyes:     General: No scleral icterus.       Right eye: No discharge.        Left eye: No discharge.     Extraocular Movements: Extraocular movements intact.     Conjunctiva/sclera: Conjunctivae normal.     Pupils: Pupils are equal, round, and reactive to light.  Cardiovascular:     Rate and Rhythm: Normal rate and regular rhythm.     Pulses: Normal pulses.     Heart sounds: Normal heart sounds. No murmur heard.    No friction rub. No gallop.  Pulmonary:     Effort: Pulmonary effort is normal. No respiratory distress.     Breath sounds: Normal breath sounds. No stridor. No wheezing, rhonchi or rales.  Chest:     Chest wall: No tenderness.  Musculoskeletal:        General: Normal range of motion.     Cervical back: Normal range of motion and neck supple.  Skin:    General: Skin is warm and dry.     Capillary Refill: Capillary refill takes less than 2 seconds.     Coloration: Skin is not jaundiced or pale.     Findings: No bruising, erythema, lesion or rash.  Neurological:     General: No focal deficit present.     Mental Status: She is alert and oriented to person, place, and time. Mental status is at baseline.  Psychiatric:        Mood and Affect: Mood is anxious and depressed. Affect is labile.        Behavior: Behavior normal.        Thought Content: Thought content normal.        Judgment: Judgment normal.     Results for orders placed or performed in visit on 12/30/22  Urinalysis, Routine w reflex microscopic   Collection Time: 12/30/22  2:25 PM  Result Value Ref Range   Specific Gravity, UA <1.005 (L) 1.005 - 1.030   pH, UA 5.0 5.0 - 7.5   Color, UA Yellow Yellow   Appearance Ur Clear Clear   Leukocytes,UA Negative Negative   Protein,UA Negative Negative/Trace   Glucose, UA Negative Negative    Ketones, UA 2+ (A) Negative   RBC, UA Negative Negative   Bilirubin, UA Negative Negative  Urobilinogen, Ur 0.2 0.2 - 1.0 mg/dL   Nitrite, UA Negative Negative   Microscopic Examination Comment   CBC with Differential/Platelet   Collection Time: 12/30/22  2:27 PM  Result Value Ref Range   WBC 8.0 3.4 - 10.8 x10E3/uL   RBC 4.17 3.77 - 5.28 x10E6/uL   Hemoglobin 11.6 11.1 - 15.9 g/dL   Hematocrit 64.6 65.9 - 46.6 %   MCV 85 79 - 97 fL   MCH 27.8 26.6 - 33.0 pg   MCHC 32.9 31.5 - 35.7 g/dL   RDW 86.1 88.2 - 84.5 %   Platelets 335 150 - 450 x10E3/uL   Neutrophils 75 Not Estab. %   Lymphs 18 Not Estab. %   Monocytes 6 Not Estab. %   Eos 1 Not Estab. %   Basos 0 Not Estab. %   Neutrophils Absolute 5.9 1.4 - 7.0 x10E3/uL   Lymphocytes Absolute 1.5 0.7 - 3.1 x10E3/uL   Monocytes Absolute 0.5 0.1 - 0.9 x10E3/uL   EOS (ABSOLUTE) 0.1 0.0 - 0.4 x10E3/uL   Basophils Absolute 0.0 0.0 - 0.2 x10E3/uL   Immature Granulocytes 0 Not Estab. %   Immature Grans (Abs) 0.0 0.0 - 0.1 x10E3/uL  Comprehensive metabolic panel   Collection Time: 12/30/22  2:27 PM  Result Value Ref Range   Glucose 69 (L) 70 - 99 mg/dL   BUN 10 6 - 24 mg/dL   Creatinine, Ser 9.15 0.57 - 1.00 mg/dL   eGFR 84 >40 fO/fpw/8.26   BUN/Creatinine Ratio 12 9 - 23   Sodium 135 134 - 144 mmol/L   Potassium 4.0 3.5 - 5.2 mmol/L   Chloride 98 96 - 106 mmol/L   CO2 19 (L) 20 - 29 mmol/L   Calcium 9.1 8.7 - 10.2 mg/dL   Total Protein 5.5 (L) 6.0 - 8.5 g/dL   Albumin 3.8 3.8 - 4.9 g/dL   Globulin, Total 1.7 1.5 - 4.5 g/dL   Albumin/Globulin Ratio 2.2 1.2 - 2.2   Bilirubin Total 0.3 0.0 - 1.2 mg/dL   Alkaline Phosphatase 74 44 - 121 IU/L   AST 25 0 - 40 IU/L   ALT 19 0 - 32 IU/L  Lipid Panel w/o Chol/HDL Ratio   Collection Time: 12/30/22  2:27 PM  Result Value Ref Range   Cholesterol, Total 201 (H) 100 - 199 mg/dL   Triglycerides 47 0 - 149 mg/dL   HDL 71 >60 mg/dL   VLDL Cholesterol Cal 9 5 - 40 mg/dL   LDL Chol Calc  (NIH) 121 (H) 0 - 99 mg/dL  TSH   Collection Time: 12/30/22  2:27 PM  Result Value Ref Range   TSH 0.917 0.450 - 4.500 uIU/mL      Assessment & Plan:   Problem List Items Addressed This Visit       Other   Major depression, recurrent (HCC)   Did not do well with either lexapro or prozac  with heightened anxiety and irritability. Will start low dose abilify  for concerns for hypomania. Recheck in 2 weeks. Call with any concerns.       Relevant Orders   Ambulatory referral to Psychiatry   PTSD (post-traumatic stress disorder)   Did not do well with either lexapro or prozac  with heightened anxiety and irritability. Will start low dose abilify  for concerns for hypomania. Recheck in 2 weeks. Call with any concerns.       Relevant Orders   Ambulatory referral to Psychiatry   Anorexia nervosa - Primary   Did  not do well with either lexapro or prozac  with heightened anxiety and irritability. Will start low dose abilify  for concerns for hypomania. Referral to psychiatry placed today. Recheck in 2 weeks. Call with any concerns.       Relevant Orders   Ambulatory referral to Psychiatry   Other Visit Diagnoses       Needs flu shot       Flu shot given today.   Relevant Orders   Flu vaccine trivalent PF, 6mos and older(Flulaval,Afluria,Fluarix,Fluzone) (Completed)        Follow up plan: Return in about 2 weeks (around 08/18/2023).  >25 minutes spent with patient and husband today

## 2023-08-07 ENCOUNTER — Telehealth: Payer: Self-pay | Admitting: Family Medicine

## 2023-08-07 NOTE — Telephone Encounter (Signed)
 Contacted the patient's insurance. Additional questions were needed by the insurance in order to approve the medication. Questions answered appropriately based on providers note. Received approval- [id number]. Approval is good from 08/07/23 - 08/06/24. Per insurance, approval may take up to an hour to go through. Will call the pharmacy in an hour and have them run the prescription to make sure it goes through.

## 2023-08-07 NOTE — Telephone Encounter (Signed)
 Called pharmacy and made sure RX can be filled.   Called patient and notified her that medication has been approved and that the pharmacy was filling her medication.

## 2023-08-07 NOTE — Telephone Encounter (Signed)
 This is a preferred drug on medicaid's list- unsure why it needed a PA to begin with

## 2023-08-07 NOTE — Telephone Encounter (Signed)
 The patient called in stating her medicine the ARIPiprazole (ABILIFY) 2 MG tablet requires a prior authorization or a safety report. Please assist patient further

## 2023-08-07 NOTE — Telephone Encounter (Signed)
 Did they give a reason why it was denied?

## 2023-08-09 DIAGNOSIS — F5000 Anorexia nervosa, unspecified: Secondary | ICD-10-CM | POA: Insufficient documentation

## 2023-08-09 NOTE — Assessment & Plan Note (Signed)
 Did not do well with either lexapro or prozac with heightened anxiety and irritability. Will start low dose abilify for concerns for hypomania. Recheck in 2 weeks. Call with any concerns.

## 2023-08-09 NOTE — Assessment & Plan Note (Signed)
 Did not do well with either lexapro or prozac with heightened anxiety and irritability. Will start low dose abilify for concerns for hypomania. Referral to psychiatry placed today. Recheck in 2 weeks. Call with any concerns.

## 2023-08-11 ENCOUNTER — Ambulatory Visit: Payer: Self-pay | Admitting: *Deleted

## 2023-08-11 MED ORDER — LEVOCETIRIZINE DIHYDROCHLORIDE 5 MG PO TABS: 5.0000 mg | ORAL_TABLET | Freq: Every evening | ORAL | 1 refills | Status: AC

## 2023-08-11 NOTE — Telephone Encounter (Signed)
  Chief Complaint: requesting to reorder allergy medication before refills ran out. Does not remember the name of medication. Last OV 08/04/23. Symptoms: seasonal allergies  Frequency: na Pertinent Negatives: Patient denies na Disposition: [] ED /[] Urgent Care (no appt availability in office) / [] Appointment(In office/virtual)/ []  [place] [facility]/ [] Home Care/ [] Refused Recommended Disposition /[] [place] [facility]/ [x]  Follow-up with PCP Additional Notes:   Last OV 08/04/23 and pt reports she did not get a chance to ask PCP if she could reorder previous medication for allergies. Refills ran out and she can tell a difference without medication . Please advise ... Summary: rx req / allergy concern   The patient shares that they have been previously been prescribed sinus/allergy medication to help with their symptoms of discomfort  The patient would like to continue taking the medication but is unable to remember the name  The patient would like to speak with a member of clinical staff when possible about potential options  Please contact when available            Reason for Disposition  Prescription request for new medicine (not a refill)  Answer Assessment - Initial Assessment Questions 1. NAME of MEDICINE: What medicine(s) are you calling about?     Medication for allergies, patient can not remember name  2. QUESTION: What is your question? (e.g., double dose of medicine, side effect)     Can PCP prescribed medication for seasonal allergies again? Does not remember name of med but refills ran out .  3. PRESCRIBER: Who prescribed the medicine? Reason: if prescribed by specialist, call should be referred to that group.     PCP 4. SYMPTOMS: Do you have any symptoms? If Yes, ask: What symptoms are you having?  How bad are the symptoms (e.g., mild, moderate, severe)     Seasonal allergies  5. PREGNANCY:  Is there any chance that you are pregnant? When was your  last menstrual period?     na  Protocols used: Medication Question Call-A-AH

## 2023-08-20 ENCOUNTER — Ambulatory Visit: Payer: Medicaid Other | Admitting: Family Medicine

## 2023-09-17 ENCOUNTER — Telehealth: Payer: Self-pay | Admitting: Family Medicine

## 2023-09-17 DIAGNOSIS — F5000 Anorexia nervosa, unspecified: Secondary | ICD-10-CM

## 2023-09-17 DIAGNOSIS — F4310 Post-traumatic stress disorder, unspecified: Secondary | ICD-10-CM

## 2023-09-17 DIAGNOSIS — F332 Major depressive disorder, recurrent severe without psychotic features: Secondary | ICD-10-CM

## 2023-09-17 NOTE — Telephone Encounter (Signed)
Can this referral be entered for the patient? Last seen 08/04/23.

## 2023-09-17 NOTE — Telephone Encounter (Signed)
Called patient left message on machine to call and schedule an appointment.

## 2023-09-17 NOTE — Telephone Encounter (Signed)
No showed last appointment. Please get her scheduled for follow up

## 2023-09-17 NOTE — Telephone Encounter (Signed)
Referral Request - Did the patient discuss referral with their provider in the last year? Yes (If No - schedule appointment) (If Yes - send message)  Appointment offered? No  Type of order/referral and detailed reason for visit: Psychologist  Preference of office, provider, location: [facility] [email or web address]   If referral order, have you been seen by this specialty before? No (If Yes, this issue or another issue? When? Where?  Can we respond through MyChart? Yes

## 2023-09-18 ENCOUNTER — Ambulatory Visit: Payer: Medicaid Other | Admitting: Family Medicine

## 2023-09-18 ENCOUNTER — Encounter: Payer: Self-pay | Admitting: Family Medicine

## 2023-09-18 VITALS — BP 99/64 | HR 73 | Temp 97.6°F | Ht 64.0 in | Wt 138.6 lb

## 2023-09-18 DIAGNOSIS — F4310 Post-traumatic stress disorder, unspecified: Secondary | ICD-10-CM

## 2023-09-18 DIAGNOSIS — F332 Major depressive disorder, recurrent severe without psychotic features: Secondary | ICD-10-CM

## 2023-09-18 MED ORDER — ARIPIPRAZOLE 5 MG PO TABS: 5.0000 mg | ORAL_TABLET | Freq: Every day | ORAL | 3 refills | Status: AC

## 2023-09-18 NOTE — Assessment & Plan Note (Signed)
Doing better on the abilify, but not quite there. Has referral in for psychiatry. Will increase to 5mg  and recheck in 1 month. Call with any concerns.

## 2023-09-18 NOTE — Progress Notes (Signed)
BP 99/64 (BP Location: Right Arm, Patient Position: Sitting, Cuff Size: Normal)   Pulse 73   Temp 97.6 F (36.4 C) (Oral)   Ht 5\' 4"  (1.626 m)   Wt 138 lb 9.6 oz (62.9 kg)   LMP 08/04/2016   SpO2 100%   BMI 23.79 kg/m    Subjective:    [name], female    DOB: 10-10-1970, 53 y.o.   MRN: [id number]  HPI: Beverly Haley is a 53 y.o. female  Chief Complaint  Patient presents with   Anxiety   ANXIETY/DEPRESSION- continues with a lot of issues with her Mom, grieving her son, caring for her daughter and a lot of stress Duration: chronic Status:better Anxious mood: yes  Excessive worrying: yes Irritability: yes  Sweating: no Nausea: no Palpitations:no Hyperventilation: no Panic attacks: yes Agoraphobia: no  Obscessions/compulsions: no Depressed mood: yes    09/18/2023    8:07 AM 08/04/2023   10:52 AM 06/30/2023    1:40 PM 12/30/2022    1:32 PM 10/22/2022    3:50 PM  Depression screen PHQ 2/9  Decreased Interest 0 2 0 0 2  Down, Depressed, Hopeless 0 3 0 0 1  PHQ - 2 Score 0 5 0 0 3  Altered sleeping 2 3 3 3 2   Tired, decreased energy 0 0 2 0 0  Change in appetite 2 3 0 3 0  Feeling bad or failure about yourself  2 3 0 0 0  Trouble concentrating 0 3 1 0 0  Moving slowly or fidgety/restless 0 3 0 0 0  Suicidal thoughts 0 0 0 0 0  PHQ-9 Score 6 20 6 6 5   Difficult doing work/chores Somewhat difficult Very difficult  Not difficult at all Not difficult at all      09/18/2023    8:07 AM 08/04/2023   10:52 AM 06/30/2023    1:41 PM 12/30/2022    1:33 PM  GAD 7 : Generalized Anxiety Score  Nervous, Anxious, on Edge 1 3 1  0  Control/stop worrying 1 3 1  0  Worry too much - different things 1 3 0 0  Trouble relaxing 1 3 1  0  Restless 2 3 0 1  Easily annoyed or irritable 0 3 1 0  Afraid - awful might happen 0 2 0 0  Total GAD 7 Score 6 20 4 1   Anxiety Difficulty Somewhat difficult Very difficult Somewhat difficult Not difficult at all    Anhedonia: no Weight changes: no Insomnia: yes   Hypersomnia: no Fatigue/loss of energy: yes Feelings of worthlessness: no Feelings of guilt: yes Impaired concentration/indecisiveness: yes Suicidal ideations: no  Crying spells: yes Recent Stressors/Life Changes: yes   Relationship problems: no   Family stress: yes     Financial stress: no    Job stress: no    Recent death/loss: yes  Relevant past medical, surgical, family and social history reviewed and updated as indicated. Interim medical history since our last visit reviewed. Allergies and medications reviewed and updated.  Review of Systems  Constitutional: Negative.   Respiratory: Negative.    Cardiovascular: Negative.   Musculoskeletal: Negative.   Psychiatric/Behavioral:  Positive for agitation and dysphoric mood. Negative for behavioral problems, confusion, decreased concentration, hallucinations, self-injury, sleep disturbance and suicidal ideas. The patient is nervous/anxious. The patient is not hyperactive.     Per HPI unless specifically indicated above     Objective:    BP 99/64 (BP Location: Right Arm, Patient Position: Sitting,  Cuff Size: Normal)   Pulse 73   Temp 97.6 F (36.4 C) (Oral)   Ht 5\' 4"  (1.626 m)   Wt 138 lb 9.6 oz (62.9 kg)   LMP 08/04/2016   SpO2 100%   BMI 23.79 kg/m   Wt Readings from Last 3 Encounters:  09/18/23 138 lb 9.6 oz (62.9 kg)  08/04/23 139 lb (63 kg)  12/30/22 151 lb 6.4 oz (68.7 kg)    Physical Exam Vitals and nursing note reviewed.  Constitutional:      General: She is not in acute distress.    Appearance: Normal appearance. She is not ill-appearing, toxic-appearing or diaphoretic.  HENT:     Head: Normocephalic and atraumatic.     Right Ear: External ear normal.     Left Ear: External ear normal.     Nose: Nose normal.     Mouth/Throat:     Mouth: Mucous membranes are moist.     Pharynx: Oropharynx is clear.  Eyes:     General: No scleral icterus.        Right eye: No discharge.        Left eye: No discharge.     Extraocular Movements: Extraocular movements intact.     Conjunctiva/sclera: Conjunctivae normal.     Pupils: Pupils are equal, round, and reactive to light.  Cardiovascular:     Rate and Rhythm: Normal rate and regular rhythm.     Pulses: Normal pulses.     Heart sounds: Normal heart sounds. No murmur heard.    No friction rub. No gallop.  Pulmonary:     Effort: Pulmonary effort is normal. No respiratory distress.     Breath sounds: Normal breath sounds. No stridor. No wheezing, rhonchi or rales.  Chest:     Chest wall: No tenderness.  Musculoskeletal:        General: Normal range of motion.     Cervical back: Normal range of motion and neck supple.  Skin:    General: Skin is warm and dry.     Capillary Refill: Capillary refill takes less than 2 seconds.     Coloration: Skin is not jaundiced or pale.     Findings: No bruising, erythema, lesion or rash.  Neurological:     General: No focal deficit present.     Mental Status: She is alert and oriented to person, place, and time. Mental status is at baseline.  Psychiatric:        Mood and Affect: Mood normal.        Behavior: Behavior normal.        Thought Content: Thought content normal.        Judgment: Judgment normal.     Results for orders placed or performed in visit on 12/30/22  Urinalysis, Routine w reflex microscopic   Collection Time: 12/30/22  2:25 PM  Result Value Ref Range   Specific Gravity, UA <1.005 (L) 1.005 - 1.030   pH, UA 5.0 5.0 - 7.5   Color, UA Yellow Yellow   Appearance Ur Clear Clear   Leukocytes,UA Negative Negative   Protein,UA Negative Negative/Trace   Glucose, UA Negative Negative   Ketones, UA 2+ (A) Negative   RBC, UA Negative Negative   Bilirubin, UA Negative Negative   Urobilinogen, Ur 0.2 0.2 - 1.0 mg/dL   Nitrite, UA Negative Negative   Microscopic Examination Comment   CBC with Differential/Platelet   Collection Time:  12/30/22  2:27 PM  Result Value Ref Range  WBC 8.0 3.4 - 10.8 x10E3/uL   RBC 4.17 3.77 - 5.28 x10E6/uL   Hemoglobin 11.6 11.1 - 15.9 g/dL   Hematocrit 16.1 09.6 - 46.6 %   MCV 85 79 - 97 fL   MCH 27.8 26.6 - 33.0 pg   MCHC 32.9 31.5 - 35.7 g/dL   RDW 04.5 40.9 - 81.1 %   Platelets 335 150 - 450 x10E3/uL   Neutrophils 75 Not Estab. %   Lymphs 18 Not Estab. %   Monocytes 6 Not Estab. %   Eos 1 Not Estab. %   Basos 0 Not Estab. %   Neutrophils Absolute 5.9 1.4 - 7.0 x10E3/uL   Lymphocytes Absolute 1.5 0.7 - 3.1 x10E3/uL   Monocytes Absolute 0.5 0.1 - 0.9 x10E3/uL   EOS (ABSOLUTE) 0.1 0.0 - 0.4 x10E3/uL   Basophils Absolute 0.0 0.0 - 0.2 x10E3/uL   Immature Granulocytes 0 Not Estab. %   Immature Grans (Abs) 0.0 0.0 - 0.1 x10E3/uL  Comprehensive metabolic panel   Collection Time: 12/30/22  2:27 PM  Result Value Ref Range   Glucose 69 (L) 70 - 99 mg/dL   BUN 10 6 - 24 mg/dL   Creatinine, Ser 9.14 0.57 - 1.00 mg/dL   eGFR 84 >78 GN/FAO/1.30   BUN/Creatinine Ratio 12 9 - 23   Sodium 135 134 - 144 mmol/L   Potassium 4.0 3.5 - 5.2 mmol/L   Chloride 98 96 - 106 mmol/L   CO2 19 (L) 20 - 29 mmol/L   Calcium 9.1 8.7 - 10.2 mg/dL   Total Protein 5.5 (L) 6.0 - 8.5 g/dL   Albumin 3.8 3.8 - 4.9 g/dL   Globulin, Total 1.7 1.5 - 4.5 g/dL   Albumin/Globulin Ratio 2.2 1.2 - 2.2   Bilirubin Total 0.3 0.0 - 1.2 mg/dL   Alkaline Phosphatase 74 44 - 121 IU/L   AST 25 0 - 40 IU/L   ALT 19 0 - 32 IU/L  Lipid Panel w/o Chol/HDL Ratio   Collection Time: 12/30/22  2:27 PM  Result Value Ref Range   Cholesterol, Total 201 (H) 100 - 199 mg/dL   Triglycerides 47 0 - 149 mg/dL   HDL 71 >86 mg/dL   VLDL Cholesterol Cal 9 5 - 40 mg/dL   LDL Chol Calc (NIH) 578 (H) 0 - 99 mg/dL  TSH   Collection Time: 12/30/22  2:27 PM  Result Value Ref Range   TSH 0.917 0.450 - 4.500 uIU/mL      Assessment & Plan:   Problem List Items Addressed This Visit       Other   Major depression, recurrent (HCC)    Doing better on the abilify, but not quite there. Has referral in for psychiatry. Will increase to 5mg  and recheck in 1 month. Call with any concerns.       PTSD (post-traumatic stress disorder) - Primary   Doing better on the abilify, but not quite there. Has referral in for psychiatry. Will increase to 5mg  and recheck in 1 month. Call with any concerns.         Follow up plan: Return in about 4 weeks (around 10/16/2023).

## 2023-10-12 ENCOUNTER — Telehealth: Payer: Self-pay

## 2023-10-12 NOTE — Telephone Encounter (Signed)
 Copied from CRM [id number]. Topic: Referral - Status >> Oct 12, 2023  8:46 AM Everette C wrote: Reason for CRM: The patient has called for an update on their request for a referral to a behavioral health specialist  The patient has requested additional contact with their PCP directly to discuss this ongoing concern   Please contact the patient when possible >> Oct 12, 2023 10:20 AM Franciso Bend wrote: Patient states that she would like to be referred to [facility] instead of the place her referral was going to be sent to.  Patient states that her child is a patient at [facility] and is able to have virtual visits and that is what she needs. Patient states that [employer]. PA-C. Is the only provider that sees adults. Patients states that she is not doing well and needs an appointment as soon as possible. Please call patient to advise.

## 2023-10-23 DIAGNOSIS — F331 Major depressive disorder, recurrent, moderate: Secondary | ICD-10-CM | POA: Diagnosis not present

## 2023-10-23 DIAGNOSIS — F410 Panic disorder [episodic paroxysmal anxiety] without agoraphobia: Secondary | ICD-10-CM | POA: Diagnosis not present

## 2023-10-23 DIAGNOSIS — F4312 Post-traumatic stress disorder, chronic: Secondary | ICD-10-CM | POA: Diagnosis not present

## 2023-11-11 DIAGNOSIS — F410 Panic disorder [episodic paroxysmal anxiety] without agoraphobia: Secondary | ICD-10-CM | POA: Diagnosis not present

## 2023-11-11 DIAGNOSIS — F331 Major depressive disorder, recurrent, moderate: Secondary | ICD-10-CM | POA: Diagnosis not present

## 2023-11-11 DIAGNOSIS — F4312 Post-traumatic stress disorder, chronic: Secondary | ICD-10-CM | POA: Diagnosis not present

## 2023-11-25 DIAGNOSIS — F331 Major depressive disorder, recurrent, moderate: Secondary | ICD-10-CM | POA: Diagnosis not present

## 2023-11-25 DIAGNOSIS — F4312 Post-traumatic stress disorder, chronic: Secondary | ICD-10-CM | POA: Diagnosis not present

## 2023-11-25 DIAGNOSIS — F410 Panic disorder [episodic paroxysmal anxiety] without agoraphobia: Secondary | ICD-10-CM | POA: Diagnosis not present

## 2023-12-09 DIAGNOSIS — F331 Major depressive disorder, recurrent, moderate: Secondary | ICD-10-CM | POA: Diagnosis not present

## 2023-12-09 DIAGNOSIS — F410 Panic disorder [episodic paroxysmal anxiety] without agoraphobia: Secondary | ICD-10-CM | POA: Diagnosis not present

## 2023-12-09 DIAGNOSIS — F4312 Post-traumatic stress disorder, chronic: Secondary | ICD-10-CM | POA: Diagnosis not present

## 2023-12-18 DIAGNOSIS — F411 Generalized anxiety disorder: Secondary | ICD-10-CM | POA: Diagnosis not present

## 2023-12-18 DIAGNOSIS — F331 Major depressive disorder, recurrent, moderate: Secondary | ICD-10-CM | POA: Diagnosis not present

## 2023-12-18 DIAGNOSIS — F4312 Post-traumatic stress disorder, chronic: Secondary | ICD-10-CM | POA: Diagnosis not present

## 2023-12-23 DIAGNOSIS — F410 Panic disorder [episodic paroxysmal anxiety] without agoraphobia: Secondary | ICD-10-CM | POA: Diagnosis not present

## 2023-12-23 DIAGNOSIS — F4312 Post-traumatic stress disorder, chronic: Secondary | ICD-10-CM | POA: Diagnosis not present

## 2023-12-23 DIAGNOSIS — F411 Generalized anxiety disorder: Secondary | ICD-10-CM | POA: Diagnosis not present

## 2023-12-23 DIAGNOSIS — F331 Major depressive disorder, recurrent, moderate: Secondary | ICD-10-CM | POA: Diagnosis not present

## 2023-12-25 DIAGNOSIS — F331 Major depressive disorder, recurrent, moderate: Secondary | ICD-10-CM | POA: Diagnosis not present

## 2023-12-25 DIAGNOSIS — F4312 Post-traumatic stress disorder, chronic: Secondary | ICD-10-CM | POA: Diagnosis not present

## 2023-12-25 DIAGNOSIS — F410 Panic disorder [episodic paroxysmal anxiety] without agoraphobia: Secondary | ICD-10-CM | POA: Diagnosis not present

## 2023-12-25 DIAGNOSIS — F411 Generalized anxiety disorder: Secondary | ICD-10-CM | POA: Diagnosis not present

## 2024-01-01 DIAGNOSIS — F4312 Post-traumatic stress disorder, chronic: Secondary | ICD-10-CM | POA: Diagnosis not present

## 2024-01-01 DIAGNOSIS — F331 Major depressive disorder, recurrent, moderate: Secondary | ICD-10-CM | POA: Diagnosis not present

## 2024-01-01 DIAGNOSIS — F411 Generalized anxiety disorder: Secondary | ICD-10-CM | POA: Diagnosis not present

## 2024-01-08 DIAGNOSIS — F4312 Post-traumatic stress disorder, chronic: Secondary | ICD-10-CM | POA: Diagnosis not present

## 2024-01-08 DIAGNOSIS — F411 Generalized anxiety disorder: Secondary | ICD-10-CM | POA: Diagnosis not present

## 2024-01-08 DIAGNOSIS — F331 Major depressive disorder, recurrent, moderate: Secondary | ICD-10-CM | POA: Diagnosis not present

## 2024-01-14 DIAGNOSIS — F410 Panic disorder [episodic paroxysmal anxiety] without agoraphobia: Secondary | ICD-10-CM | POA: Diagnosis not present

## 2024-01-14 DIAGNOSIS — F331 Major depressive disorder, recurrent, moderate: Secondary | ICD-10-CM | POA: Diagnosis not present

## 2024-01-14 DIAGNOSIS — F411 Generalized anxiety disorder: Secondary | ICD-10-CM | POA: Diagnosis not present

## 2024-01-14 DIAGNOSIS — F4312 Post-traumatic stress disorder, chronic: Secondary | ICD-10-CM | POA: Diagnosis not present

## 2024-01-20 DIAGNOSIS — F4312 Post-traumatic stress disorder, chronic: Secondary | ICD-10-CM | POA: Diagnosis not present

## 2024-01-20 DIAGNOSIS — F331 Major depressive disorder, recurrent, moderate: Secondary | ICD-10-CM | POA: Diagnosis not present

## 2024-01-20 DIAGNOSIS — F410 Panic disorder [episodic paroxysmal anxiety] without agoraphobia: Secondary | ICD-10-CM | POA: Diagnosis not present

## 2024-01-20 DIAGNOSIS — F411 Generalized anxiety disorder: Secondary | ICD-10-CM | POA: Diagnosis not present

## 2024-01-21 ENCOUNTER — Ambulatory Visit: Admitting: Family Medicine

## 2024-01-22 DIAGNOSIS — F4312 Post-traumatic stress disorder, chronic: Secondary | ICD-10-CM | POA: Diagnosis not present

## 2024-01-22 DIAGNOSIS — F331 Major depressive disorder, recurrent, moderate: Secondary | ICD-10-CM | POA: Diagnosis not present

## 2024-01-22 DIAGNOSIS — F410 Panic disorder [episodic paroxysmal anxiety] without agoraphobia: Secondary | ICD-10-CM | POA: Diagnosis not present

## 2024-01-22 DIAGNOSIS — F411 Generalized anxiety disorder: Secondary | ICD-10-CM | POA: Diagnosis not present

## 2024-01-29 DIAGNOSIS — F4312 Post-traumatic stress disorder, chronic: Secondary | ICD-10-CM | POA: Diagnosis not present

## 2024-01-29 DIAGNOSIS — F410 Panic disorder [episodic paroxysmal anxiety] without agoraphobia: Secondary | ICD-10-CM | POA: Diagnosis not present

## 2024-01-29 DIAGNOSIS — F411 Generalized anxiety disorder: Secondary | ICD-10-CM | POA: Diagnosis not present

## 2024-01-29 DIAGNOSIS — F331 Major depressive disorder, recurrent, moderate: Secondary | ICD-10-CM | POA: Diagnosis not present

## 2024-02-12 DIAGNOSIS — F4312 Post-traumatic stress disorder, chronic: Secondary | ICD-10-CM | POA: Diagnosis not present

## 2024-02-12 DIAGNOSIS — F331 Major depressive disorder, recurrent, moderate: Secondary | ICD-10-CM | POA: Diagnosis not present

## 2024-02-12 DIAGNOSIS — F411 Generalized anxiety disorder: Secondary | ICD-10-CM | POA: Diagnosis not present

## 2024-02-12 DIAGNOSIS — F410 Panic disorder [episodic paroxysmal anxiety] without agoraphobia: Secondary | ICD-10-CM | POA: Diagnosis not present

## 2024-02-17 DIAGNOSIS — F410 Panic disorder [episodic paroxysmal anxiety] without agoraphobia: Secondary | ICD-10-CM | POA: Diagnosis not present

## 2024-02-17 DIAGNOSIS — F4312 Post-traumatic stress disorder, chronic: Secondary | ICD-10-CM | POA: Diagnosis not present

## 2024-02-17 DIAGNOSIS — F331 Major depressive disorder, recurrent, moderate: Secondary | ICD-10-CM | POA: Diagnosis not present

## 2024-02-17 DIAGNOSIS — F411 Generalized anxiety disorder: Secondary | ICD-10-CM | POA: Diagnosis not present

## 2024-02-19 DIAGNOSIS — F331 Major depressive disorder, recurrent, moderate: Secondary | ICD-10-CM | POA: Diagnosis not present

## 2024-02-19 DIAGNOSIS — F411 Generalized anxiety disorder: Secondary | ICD-10-CM | POA: Diagnosis not present

## 2024-02-19 DIAGNOSIS — F4312 Post-traumatic stress disorder, chronic: Secondary | ICD-10-CM | POA: Diagnosis not present

## 2024-02-19 DIAGNOSIS — F410 Panic disorder [episodic paroxysmal anxiety] without agoraphobia: Secondary | ICD-10-CM | POA: Diagnosis not present

## 2024-02-23 ENCOUNTER — Encounter: Payer: Self-pay | Admitting: Family Medicine

## 2024-02-23 ENCOUNTER — Ambulatory Visit (INDEPENDENT_AMBULATORY_CARE_PROVIDER_SITE_OTHER): Admitting: Family Medicine

## 2024-02-23 VITALS — BP 112/76 | HR 75 | Temp 98.2°F

## 2024-02-23 DIAGNOSIS — B079 Viral wart, unspecified: Secondary | ICD-10-CM

## 2024-02-23 NOTE — Progress Notes (Signed)
 BP 112/76   Pulse 75   Temp 98.2 F (36.8 C) (Oral)   LMP 08/04/2016    Subjective:    [name], female    DOB: [birth year], 53 y.o.   MRN: [id number]  HPI: Beverly Haley is a 53 y.o. female  Chief Complaint  Patient presents with   Warts   SKIN LESION- has tried OTC topicals for several months without any benefit. It keeps getting bigger and she wants it to go away. Duration: about a year or more Location: anterior L shin Painful: no Itching: no Onset: sudden Context: bigger Associated signs and symptoms: nothing History of skin cancer: no History of precancerous skin lesions: no   Relevant past medical, surgical, family and social history reviewed and updated as indicated. Interim medical history since our last visit reviewed. Allergies and medications reviewed and updated.  Review of Systems  Constitutional: Negative.   Respiratory: Negative.    Cardiovascular: Negative.   Musculoskeletal: Negative.   Skin: Negative.        Wart L anterior shin  Neurological: Negative.   Psychiatric/Behavioral: Negative.      Per HPI unless specifically indicated above     Objective:    BP 112/76   Pulse 75   Temp 98.2 F (36.8 C) (Oral)   LMP 08/04/2016   Wt Readings from Last 3 Encounters:  09/18/23 138 lb 9.6 oz (62.9 kg)  08/04/23 139 lb (63 kg)  12/30/22 151 lb 6.4 oz (68.7 kg)    Physical Exam Vitals and nursing note reviewed.  Constitutional:      General: She is not in acute distress.    Appearance: Normal appearance. She is not ill-appearing, toxic-appearing or diaphoretic.  HENT:     Head: Normocephalic and atraumatic.     Right Ear: External ear normal.     Left Ear: External ear normal.     Nose: Nose normal.     Mouth/Throat:     Mouth: Mucous membranes are moist.     Pharynx: Oropharynx is clear.  Eyes:     General: No scleral icterus.       Right eye: No discharge.        Left eye: No discharge.     Extraocular  Movements: Extraocular movements intact.     Conjunctiva/sclera: Conjunctivae normal.     Pupils: Pupils are equal, round, and reactive to light.  Cardiovascular:     Rate and Rhythm: Normal rate and regular rhythm.     Pulses: Normal pulses.     Heart sounds: Normal heart sounds. No murmur heard.    No friction rub. No gallop.  Pulmonary:     Effort: Pulmonary effort is normal. No respiratory distress.     Breath sounds: Normal breath sounds. No stridor. No wheezing, rhonchi or rales.  Chest:     Chest wall: No tenderness.  Musculoskeletal:        General: Normal range of motion.     Cervical back: Normal range of motion and neck supple.  Skin:    General: Skin is warm and dry.     Capillary Refill: Capillary refill takes less than 2 seconds.     Coloration: Skin is not jaundiced or pale.     Findings: No bruising, erythema, lesion or rash.     Comments: 1cm raised lesion on anterior shin just below knee covered in white cream  Neurological:     General: No focal deficit present.  Mental Status: She is alert and oriented to person, place, and time. Mental status is at baseline.  Psychiatric:        Mood and Affect: Mood normal.        Behavior: Behavior normal.        Thought Content: Thought content normal.        Judgment: Judgment normal.     Results for orders placed or performed in visit on 12/30/22  Urinalysis, Routine w reflex microscopic   Collection Time: 12/30/22  2:25 PM  Result Value Ref Range   Specific Gravity, UA <1.005 (L) 1.005 - 1.030   pH, UA 5.0 5.0 - 7.5   Color, UA Yellow Yellow   Appearance Ur Clear Clear   Leukocytes,UA Negative Negative   Protein,UA Negative Negative/Trace   Glucose, UA Negative Negative   Ketones, UA 2+ (A) Negative   RBC, UA Negative Negative   Bilirubin, UA Negative Negative   Urobilinogen, Ur 0.2 0.2 - 1.0 mg/dL   Nitrite, UA Negative Negative   Microscopic Examination Comment   CBC with Differential/Platelet    Collection Time: 12/30/22  2:27 PM  Result Value Ref Range   WBC 8.0 3.4 - 10.8 x10E3/uL   RBC 4.17 3.77 - 5.28 x10E6/uL   Hemoglobin 11.6 11.1 - 15.9 g/dL   Hematocrit 64.6 65.9 - 46.6 %   MCV 85 79 - 97 fL   MCH 27.8 26.6 - 33.0 pg   MCHC 32.9 31.5 - 35.7 g/dL   RDW 86.1 88.2 - 84.5 %   Platelets 335 150 - 450 x10E3/uL   Neutrophils 75 Not Estab. %   Lymphs 18 Not Estab. %   Monocytes 6 Not Estab. %   Eos 1 Not Estab. %   Basos 0 Not Estab. %   Neutrophils Absolute 5.9 1.4 - 7.0 x10E3/uL   Lymphocytes Absolute 1.5 0.7 - 3.1 x10E3/uL   Monocytes Absolute 0.5 0.1 - 0.9 x10E3/uL   EOS (ABSOLUTE) 0.1 0.0 - 0.4 x10E3/uL   Basophils Absolute 0.0 0.0 - 0.2 x10E3/uL   Immature Granulocytes 0 Not Estab. %   Immature Grans (Abs) 0.0 0.0 - 0.1 x10E3/uL  Comprehensive metabolic panel   Collection Time: 12/30/22  2:27 PM  Result Value Ref Range   Glucose 69 (L) 70 - 99 mg/dL   BUN 10 6 - 24 mg/dL   Creatinine, Ser 9.15 0.57 - 1.00 mg/dL   eGFR 84 >40 fO/fpw/8.26   BUN/Creatinine Ratio 12 9 - 23   Sodium 135 134 - 144 mmol/L   Potassium 4.0 3.5 - 5.2 mmol/L   Chloride 98 96 - 106 mmol/L   CO2 19 (L) 20 - 29 mmol/L   Calcium 9.1 8.7 - 10.2 mg/dL   Total Protein 5.5 (L) 6.0 - 8.5 g/dL   Albumin 3.8 3.8 - 4.9 g/dL   Globulin, Total 1.7 1.5 - 4.5 g/dL   Albumin/Globulin Ratio 2.2 1.2 - 2.2   Bilirubin Total 0.3 0.0 - 1.2 mg/dL   Alkaline Phosphatase 74 44 - 121 IU/L   AST 25 0 - 40 IU/L   ALT 19 0 - 32 IU/L  Lipid Panel w/o Chol/HDL Ratio   Collection Time: 12/30/22  2:27 PM  Result Value Ref Range   Cholesterol, Total 201 (H) 100 - 199 mg/dL   Triglycerides 47 0 - 149 mg/dL   HDL 71 >60 mg/dL   VLDL Cholesterol Cal 9 5 - 40 mg/dL   LDL Chol Calc (NIH) 878 (H) 0 -  99 mg/dL  TSH   Collection Time: 12/30/22  2:27 PM  Result Value Ref Range   TSH 0.917 0.450 - 4.500 uIU/mL      Assessment & Plan:   Problem List Items Addressed This Visit   None Visit Diagnoses       Viral  warts, unspecified type    -  Primary   Unfortunately we don't have a cryo tank available in the office. Has failed OTC measures. Will get her into dermatology. Call with any concerns.   Relevant Orders   Ambulatory referral to Dermatology        Follow up plan: Return for As scheduled.

## 2024-02-26 DIAGNOSIS — F411 Generalized anxiety disorder: Secondary | ICD-10-CM | POA: Diagnosis not present

## 2024-02-26 DIAGNOSIS — F4312 Post-traumatic stress disorder, chronic: Secondary | ICD-10-CM | POA: Diagnosis not present

## 2024-02-26 DIAGNOSIS — F331 Major depressive disorder, recurrent, moderate: Secondary | ICD-10-CM | POA: Diagnosis not present

## 2024-02-26 DIAGNOSIS — F410 Panic disorder [episodic paroxysmal anxiety] without agoraphobia: Secondary | ICD-10-CM | POA: Diagnosis not present

## 2024-03-11 DIAGNOSIS — F410 Panic disorder [episodic paroxysmal anxiety] without agoraphobia: Secondary | ICD-10-CM | POA: Diagnosis not present

## 2024-03-11 DIAGNOSIS — F4312 Post-traumatic stress disorder, chronic: Secondary | ICD-10-CM | POA: Diagnosis not present

## 2024-03-11 DIAGNOSIS — F331 Major depressive disorder, recurrent, moderate: Secondary | ICD-10-CM | POA: Diagnosis not present

## 2024-03-11 DIAGNOSIS — F411 Generalized anxiety disorder: Secondary | ICD-10-CM | POA: Diagnosis not present

## 2024-03-16 DIAGNOSIS — F331 Major depressive disorder, recurrent, moderate: Secondary | ICD-10-CM | POA: Diagnosis not present

## 2024-03-16 DIAGNOSIS — F411 Generalized anxiety disorder: Secondary | ICD-10-CM | POA: Diagnosis not present

## 2024-03-16 DIAGNOSIS — F410 Panic disorder [episodic paroxysmal anxiety] without agoraphobia: Secondary | ICD-10-CM | POA: Diagnosis not present

## 2024-03-16 DIAGNOSIS — F4312 Post-traumatic stress disorder, chronic: Secondary | ICD-10-CM | POA: Diagnosis not present

## 2024-03-18 DIAGNOSIS — F331 Major depressive disorder, recurrent, moderate: Secondary | ICD-10-CM | POA: Diagnosis not present

## 2024-03-18 DIAGNOSIS — F4312 Post-traumatic stress disorder, chronic: Secondary | ICD-10-CM | POA: Diagnosis not present

## 2024-03-18 DIAGNOSIS — F411 Generalized anxiety disorder: Secondary | ICD-10-CM | POA: Diagnosis not present

## 2024-03-18 DIAGNOSIS — F410 Panic disorder [episodic paroxysmal anxiety] without agoraphobia: Secondary | ICD-10-CM | POA: Diagnosis not present

## 2024-03-22 ENCOUNTER — Telehealth: Payer: Self-pay

## 2024-03-22 ENCOUNTER — Telehealth: Payer: Self-pay | Admitting: Family Medicine

## 2024-03-22 NOTE — Telephone Encounter (Signed)
 Copied from CRM [id number]. Topic: Referral - Status >> Mar 17, 2024  3:10 PM Yolanda T wrote: Reason for CRM: patient called about the Dermatology referral. Offered to give the name and number but patient said she would find the number on her own >> Mar 22, 2024  3:13 PM DeAngela L wrote: Patient calling to ask if the office can change her referral to the [facility] for provider Alm Lien, MD to help her with the wart removal patient states the skin is really sore and red and the wart is spreading and she says the medication she is using is not helping get it off her left knee and she wants to get a provider to get it removed so she can walk properly again  [facility]  [address] [place], [place], [place] [address]  Phone: [phone]

## 2024-03-22 NOTE — Telephone Encounter (Unsigned)
 Copied from CRM [id number]. Topic: Referral - Status >> Mar 22, 2024  3:13 PM DeAngela L wrote: Patient calling to ask if the office can change her referral to the [facility] for provider Alm Lien, MD to help her with the wart removal patient states the skin is really sore and red and the wart is spreading and she says the medication she is using is not helping get it off her left knee and she wants to get a provider to get it removed so she can walk properly again  [facility]  [address] [place], [place], [place] [address]  Phone: [phone]

## 2024-03-22 NOTE — Telephone Encounter (Signed)
 Copied from CRM [id number]. Topic: Referral - Status >> Mar 17, 2024  3:10 PM Yolanda T wrote: Reason for CRM: patient called about the Dermatology referral. Offered to give the name and number but patient said she would find the number on her own

## 2024-03-24 DIAGNOSIS — F4312 Post-traumatic stress disorder, chronic: Secondary | ICD-10-CM | POA: Diagnosis not present

## 2024-03-24 DIAGNOSIS — F331 Major depressive disorder, recurrent, moderate: Secondary | ICD-10-CM | POA: Diagnosis not present

## 2024-03-24 DIAGNOSIS — F410 Panic disorder [episodic paroxysmal anxiety] without agoraphobia: Secondary | ICD-10-CM | POA: Diagnosis not present

## 2024-03-24 DIAGNOSIS — F411 Generalized anxiety disorder: Secondary | ICD-10-CM | POA: Diagnosis not present

## 2024-04-01 DIAGNOSIS — F331 Major depressive disorder, recurrent, moderate: Secondary | ICD-10-CM | POA: Diagnosis not present

## 2024-04-01 DIAGNOSIS — F4312 Post-traumatic stress disorder, chronic: Secondary | ICD-10-CM | POA: Diagnosis not present

## 2024-04-01 DIAGNOSIS — F411 Generalized anxiety disorder: Secondary | ICD-10-CM | POA: Diagnosis not present

## 2024-04-01 DIAGNOSIS — F410 Panic disorder [episodic paroxysmal anxiety] without agoraphobia: Secondary | ICD-10-CM | POA: Diagnosis not present

## 2024-04-08 DIAGNOSIS — F411 Generalized anxiety disorder: Secondary | ICD-10-CM | POA: Diagnosis not present

## 2024-04-08 DIAGNOSIS — F4312 Post-traumatic stress disorder, chronic: Secondary | ICD-10-CM | POA: Diagnosis not present

## 2024-04-08 DIAGNOSIS — F410 Panic disorder [episodic paroxysmal anxiety] without agoraphobia: Secondary | ICD-10-CM | POA: Diagnosis not present

## 2024-04-08 DIAGNOSIS — F331 Major depressive disorder, recurrent, moderate: Secondary | ICD-10-CM | POA: Diagnosis not present

## 2024-04-13 DIAGNOSIS — F410 Panic disorder [episodic paroxysmal anxiety] without agoraphobia: Secondary | ICD-10-CM | POA: Diagnosis not present

## 2024-04-13 DIAGNOSIS — F411 Generalized anxiety disorder: Secondary | ICD-10-CM | POA: Diagnosis not present

## 2024-04-13 DIAGNOSIS — F331 Major depressive disorder, recurrent, moderate: Secondary | ICD-10-CM | POA: Diagnosis not present

## 2024-04-13 DIAGNOSIS — F4312 Post-traumatic stress disorder, chronic: Secondary | ICD-10-CM | POA: Diagnosis not present

## 2024-04-15 DIAGNOSIS — F410 Panic disorder [episodic paroxysmal anxiety] without agoraphobia: Secondary | ICD-10-CM | POA: Diagnosis not present

## 2024-04-15 DIAGNOSIS — F411 Generalized anxiety disorder: Secondary | ICD-10-CM | POA: Diagnosis not present

## 2024-04-15 DIAGNOSIS — F4312 Post-traumatic stress disorder, chronic: Secondary | ICD-10-CM | POA: Diagnosis not present

## 2024-04-15 DIAGNOSIS — F331 Major depressive disorder, recurrent, moderate: Secondary | ICD-10-CM | POA: Diagnosis not present

## 2024-04-18 ENCOUNTER — Other Ambulatory Visit: Payer: Self-pay | Admitting: Family Medicine

## 2024-04-18 DIAGNOSIS — Z1231 Encounter for screening mammogram for malignant neoplasm of breast: Secondary | ICD-10-CM

## 2024-04-22 DIAGNOSIS — F331 Major depressive disorder, recurrent, moderate: Secondary | ICD-10-CM | POA: Diagnosis not present

## 2024-04-22 DIAGNOSIS — F410 Panic disorder [episodic paroxysmal anxiety] without agoraphobia: Secondary | ICD-10-CM | POA: Diagnosis not present

## 2024-04-22 DIAGNOSIS — F411 Generalized anxiety disorder: Secondary | ICD-10-CM | POA: Diagnosis not present

## 2024-04-22 DIAGNOSIS — F4312 Post-traumatic stress disorder, chronic: Secondary | ICD-10-CM | POA: Diagnosis not present

## 2024-04-29 DIAGNOSIS — F331 Major depressive disorder, recurrent, moderate: Secondary | ICD-10-CM | POA: Diagnosis not present

## 2024-04-29 DIAGNOSIS — F410 Panic disorder [episodic paroxysmal anxiety] without agoraphobia: Secondary | ICD-10-CM | POA: Diagnosis not present

## 2024-04-29 DIAGNOSIS — F4312 Post-traumatic stress disorder, chronic: Secondary | ICD-10-CM | POA: Diagnosis not present

## 2024-04-29 DIAGNOSIS — F411 Generalized anxiety disorder: Secondary | ICD-10-CM | POA: Diagnosis not present

## 2024-05-12 ENCOUNTER — Other Ambulatory Visit: Payer: Self-pay | Admitting: Nurse Practitioner

## 2024-05-13 NOTE — Telephone Encounter (Signed)
 Requested medications are due for refill today.  unsure  Requested medications are on the active medications list.  yes  Last refill. 02/23/2024   Future visit scheduled.   no  Notes to clinic.  Historical medication    Requested Prescriptions  Pending Prescriptions Disp Refills   amLODipine  (NORVASC ) 2.5 MG tablet [Pharmacy Med Name: AMLODIPINE  BESYLATE 2.5 MG TAB] 60 tablet 0    Sig: Take 1 tablet (2.5 mg total) by mouth daily.     Cardiovascular: Calcium Channel Blockers 2 Failed - 05/13/2024  5:49 PM      Failed - Valid encounter within last 6 months    Recent Outpatient Visits           2 months ago Viral warts, unspecified type   [place] [facility], Megan P, DO   7 months ago PTSD (post-traumatic stress disorder)   [place] [facility], Megan P, DO              Passed - Last BP in normal range    BP Readings from Last 1 Encounters:  02/23/24 112/76         Passed - Last Heart Rate in normal range    Pulse Readings from Last 1 Encounters:  02/23/24 75

## 2024-05-16 NOTE — Telephone Encounter (Signed)
 Patient is overdue for a follow up appointment. Please call to schedule and then route to provider for refill.

## 2024-05-20 DIAGNOSIS — F410 Panic disorder [episodic paroxysmal anxiety] without agoraphobia: Secondary | ICD-10-CM | POA: Diagnosis not present

## 2024-05-20 DIAGNOSIS — F411 Generalized anxiety disorder: Secondary | ICD-10-CM | POA: Diagnosis not present

## 2024-05-20 DIAGNOSIS — F331 Major depressive disorder, recurrent, moderate: Secondary | ICD-10-CM | POA: Diagnosis not present

## 2024-05-20 DIAGNOSIS — F4312 Post-traumatic stress disorder, chronic: Secondary | ICD-10-CM | POA: Diagnosis not present

## 2024-05-27 DIAGNOSIS — F411 Generalized anxiety disorder: Secondary | ICD-10-CM | POA: Diagnosis not present

## 2024-05-27 DIAGNOSIS — F4312 Post-traumatic stress disorder, chronic: Secondary | ICD-10-CM | POA: Diagnosis not present

## 2024-05-27 DIAGNOSIS — F331 Major depressive disorder, recurrent, moderate: Secondary | ICD-10-CM | POA: Diagnosis not present

## 2024-05-27 DIAGNOSIS — F410 Panic disorder [episodic paroxysmal anxiety] without agoraphobia: Secondary | ICD-10-CM | POA: Diagnosis not present

## 2024-05-31 ENCOUNTER — Ambulatory Visit: Admitting: Family Medicine

## 2024-06-02 ENCOUNTER — Other Ambulatory Visit: Payer: Self-pay | Admitting: Family Medicine

## 2024-06-03 DIAGNOSIS — F410 Panic disorder [episodic paroxysmal anxiety] without agoraphobia: Secondary | ICD-10-CM | POA: Diagnosis not present

## 2024-06-03 DIAGNOSIS — F331 Major depressive disorder, recurrent, moderate: Secondary | ICD-10-CM | POA: Diagnosis not present

## 2024-06-03 DIAGNOSIS — F4312 Post-traumatic stress disorder, chronic: Secondary | ICD-10-CM | POA: Diagnosis not present

## 2024-06-03 DIAGNOSIS — F411 Generalized anxiety disorder: Secondary | ICD-10-CM | POA: Diagnosis not present

## 2024-06-03 NOTE — Telephone Encounter (Signed)
 Requested Prescriptions  Pending Prescriptions Disp Refills   amLODipine  (NORVASC ) 2.5 MG tablet [Pharmacy Med Name: AMLODIPINE  BESYLATE 2.5 MG TAB] 15 tablet 0    Sig: Take 1 tablet (2.5 mg total) by mouth daily.     Cardiovascular: Calcium Channel Blockers 2 Failed - 06/03/2024  3:33 PM      Failed - Valid encounter within last 6 months    Recent Outpatient Visits           3 months ago Viral warts, unspecified type   [place] [facility], Megan P, DO   8 months ago PTSD (post-traumatic stress disorder)   [place] [facility], Megan P, DO              Passed - Last BP in normal range    BP Readings from Last 1 Encounters:  02/23/24 112/76         Passed - Last Heart Rate in normal range    Pulse Readings from Last 1 Encounters:  02/23/24 75

## 2024-06-07 ENCOUNTER — Ambulatory Visit
Admission: RE | Admit: 2024-06-07 | Discharge: 2024-06-07 | Disposition: A | Discharge status: Home / Self Care | Source: Ambulatory Visit | Attending: Family Medicine | Admitting: Family Medicine

## 2024-06-07 ENCOUNTER — Other Ambulatory Visit: Payer: Self-pay | Admitting: Family Medicine

## 2024-06-07 DIAGNOSIS — Z1231 Encounter for screening mammogram for malignant neoplasm of breast: Secondary | ICD-10-CM | POA: Insufficient documentation

## 2024-06-09 ENCOUNTER — Ambulatory Visit: Payer: Self-pay | Admitting: Family Medicine

## 2024-06-10 DIAGNOSIS — F4312 Post-traumatic stress disorder, chronic: Secondary | ICD-10-CM | POA: Diagnosis not present

## 2024-06-10 DIAGNOSIS — F331 Major depressive disorder, recurrent, moderate: Secondary | ICD-10-CM | POA: Diagnosis not present

## 2024-06-10 DIAGNOSIS — F411 Generalized anxiety disorder: Secondary | ICD-10-CM | POA: Diagnosis not present

## 2024-06-10 DIAGNOSIS — F410 Panic disorder [episodic paroxysmal anxiety] without agoraphobia: Secondary | ICD-10-CM | POA: Diagnosis not present

## 2024-06-15 ENCOUNTER — Encounter: Admitting: Family Medicine

## 2024-06-15 DIAGNOSIS — F410 Panic disorder [episodic paroxysmal anxiety] without agoraphobia: Secondary | ICD-10-CM | POA: Diagnosis not present

## 2024-06-15 DIAGNOSIS — F331 Major depressive disorder, recurrent, moderate: Secondary | ICD-10-CM | POA: Diagnosis not present

## 2024-06-15 DIAGNOSIS — F411 Generalized anxiety disorder: Secondary | ICD-10-CM | POA: Diagnosis not present

## 2024-06-17 DIAGNOSIS — F411 Generalized anxiety disorder: Secondary | ICD-10-CM | POA: Diagnosis not present

## 2024-06-17 DIAGNOSIS — F4312 Post-traumatic stress disorder, chronic: Secondary | ICD-10-CM | POA: Diagnosis not present

## 2024-06-17 DIAGNOSIS — F410 Panic disorder [episodic paroxysmal anxiety] without agoraphobia: Secondary | ICD-10-CM | POA: Diagnosis not present

## 2024-06-17 DIAGNOSIS — F331 Major depressive disorder, recurrent, moderate: Secondary | ICD-10-CM | POA: Diagnosis not present

## 2024-06-18 ENCOUNTER — Other Ambulatory Visit: Payer: Self-pay | Admitting: Family Medicine

## 2024-06-21 NOTE — Telephone Encounter (Signed)
 Courtesy refill. Patient will need an office visit for additional refills.  Requested Prescriptions  Pending Prescriptions Disp Refills   omeprazole  (PRILOSEC) 20 MG capsule [Pharmacy Med Name: OMEPRAZOLE  DR 20 MG CAPSULE] 30 capsule 0    Sig: Take 1 capsule (20 mg total) by mouth daily.     Gastroenterology: Proton Pump Inhibitors Passed - 06/21/2024 12:02 PM      Passed - Valid encounter within last 12 months    Recent Outpatient Visits           3 months ago Viral warts, unspecified type   [place] [facility], Megan P, DO   9 months ago PTSD (post-traumatic stress disorder)   [facility], Megan P, DO

## 2024-06-24 DIAGNOSIS — F4312 Post-traumatic stress disorder, chronic: Secondary | ICD-10-CM | POA: Diagnosis not present

## 2024-06-24 DIAGNOSIS — F411 Generalized anxiety disorder: Secondary | ICD-10-CM | POA: Diagnosis not present

## 2024-06-24 DIAGNOSIS — F410 Panic disorder [episodic paroxysmal anxiety] without agoraphobia: Secondary | ICD-10-CM | POA: Diagnosis not present

## 2024-06-24 DIAGNOSIS — F331 Major depressive disorder, recurrent, moderate: Secondary | ICD-10-CM | POA: Diagnosis not present

## 2024-07-01 ENCOUNTER — Other Ambulatory Visit: Payer: Self-pay | Admitting: Family Medicine

## 2024-07-01 DIAGNOSIS — F331 Major depressive disorder, recurrent, moderate: Secondary | ICD-10-CM | POA: Diagnosis not present

## 2024-07-01 DIAGNOSIS — F411 Generalized anxiety disorder: Secondary | ICD-10-CM | POA: Diagnosis not present

## 2024-07-01 DIAGNOSIS — F4312 Post-traumatic stress disorder, chronic: Secondary | ICD-10-CM | POA: Diagnosis not present

## 2024-07-01 DIAGNOSIS — F410 Panic disorder [episodic paroxysmal anxiety] without agoraphobia: Secondary | ICD-10-CM | POA: Diagnosis not present

## 2024-07-05 NOTE — Telephone Encounter (Signed)
 Requested medications are due for refill today.  yes  Requested medications are on the active medications list.  yes  Last refill. 06/03/2024 #15 0 rf  Future visit scheduled.   no  Notes to clinic.  Pt has missed a few scheduled appts.  Pt last seen 09/2023    Requested Prescriptions  Pending Prescriptions Disp Refills   amLODipine  (NORVASC ) 2.5 MG tablet [Pharmacy Med Name: AMLODIPINE  BESYLATE 2.5 MG TAB] 15 tablet 0    Sig: Take 1 tablet (2.5 mg total) by mouth daily.     Cardiovascular: Calcium Channel Blockers 2 Failed - 07/05/2024  3:54 PM      Failed - Valid encounter within last 6 months    Recent Outpatient Visits           4 months ago Viral warts, unspecified type   [place] [facility] [place], Megan P, DO   9 months ago PTSD (post-traumatic stress disorder)    [facility], Megan P, DO              Passed - Last BP in normal range    BP Readings from Last 1 Encounters:  02/23/24 112/76         Passed - Last Heart Rate in normal range    Pulse Readings from Last 1 Encounters:  02/23/24 75

## 2024-07-05 NOTE — Telephone Encounter (Signed)
Needs an appointment. Will get her enough medicine to make it to appointment when it's booked.   

## 2024-07-06 NOTE — Telephone Encounter (Signed)
 Called patient and left a message to call back to get scheduled.

## 2024-07-07 NOTE — Telephone Encounter (Signed)
 Appt scheduled for 07/14/2024 at 8:20 AM

## 2024-07-08 DIAGNOSIS — F410 Panic disorder [episodic paroxysmal anxiety] without agoraphobia: Secondary | ICD-10-CM | POA: Diagnosis not present

## 2024-07-08 DIAGNOSIS — F331 Major depressive disorder, recurrent, moderate: Secondary | ICD-10-CM | POA: Diagnosis not present

## 2024-07-08 DIAGNOSIS — F411 Generalized anxiety disorder: Secondary | ICD-10-CM | POA: Diagnosis not present

## 2024-07-08 DIAGNOSIS — F4312 Post-traumatic stress disorder, chronic: Secondary | ICD-10-CM | POA: Diagnosis not present

## 2024-07-14 ENCOUNTER — Ambulatory Visit: Admitting: Family Medicine

## 2024-07-16 ENCOUNTER — Other Ambulatory Visit: Payer: Self-pay | Admitting: Family Medicine

## 2024-07-19 NOTE — Telephone Encounter (Signed)
 Too soon for refill.  Requested Prescriptions  Pending Prescriptions Disp Refills   amLODipine  (NORVASC ) 2.5 MG tablet [Pharmacy Med Name: AMLODIPINE  BESYLATE 2.5 MG TAB] 15 tablet 0    Sig: Take 1 tablet (2.5 mg total) by mouth daily.     Cardiovascular: Calcium Channel Blockers 2 Failed - 07/19/2024 12:41 PM      Failed - Valid encounter within last 6 months    Recent Outpatient Visits           4 months ago Viral warts, unspecified type   [place] [facility], Megan P, DO   10 months ago PTSD (post-traumatic stress disorder)   [place] [facility], Megan P, DO              Passed - Last BP in normal range    BP Readings from Last 1 Encounters:  02/23/24 112/76         Passed - Last Heart Rate in normal range    Pulse Readings from Last 1 Encounters:  02/23/24 75

## 2024-07-22 DIAGNOSIS — F410 Panic disorder [episodic paroxysmal anxiety] without agoraphobia: Secondary | ICD-10-CM | POA: Diagnosis not present

## 2024-07-22 DIAGNOSIS — F331 Major depressive disorder, recurrent, moderate: Secondary | ICD-10-CM | POA: Diagnosis not present

## 2024-07-22 DIAGNOSIS — F411 Generalized anxiety disorder: Secondary | ICD-10-CM | POA: Diagnosis not present

## 2024-07-22 DIAGNOSIS — F4312 Post-traumatic stress disorder, chronic: Secondary | ICD-10-CM | POA: Diagnosis not present

## 2024-08-22 ENCOUNTER — Other Ambulatory Visit: Payer: Self-pay | Admitting: Family Medicine

## 2024-08-23 NOTE — Telephone Encounter (Signed)
 Requested Prescriptions  Pending Prescriptions Disp Refills   omeprazole  (PRILOSEC) 20 MG capsule [Pharmacy Med Name: OMEPRAZOLE  DR 20 MG CAPSULE] 90 capsule 0    Sig: Take 1 capsule (20 mg total) by mouth daily.     Gastroenterology: Proton Pump Inhibitors Passed - 08/23/2024  8:57 AM      Passed - Valid encounter within last 12 months    Recent Outpatient Visits           6 months ago Viral warts, unspecified type   [place] [facility] [place], Megan P, DO   11 months ago PTSD (post-traumatic stress disorder)    [facility] [place], Monserrate, DO

## 2024-08-31 ENCOUNTER — Telehealth: Payer: Self-pay

## 2024-08-31 DIAGNOSIS — F339 Major depressive disorder, recurrent, unspecified: Secondary | ICD-10-CM

## 2024-08-31 NOTE — Progress Notes (Signed)
 Thank you for this referral. The [facility] Health team receives referrals for patients who meet Complex Care Management program criteria: chronic conditions including heart failure, stroke, COPD, ESRD, Sickle Cell, Diabetes with complications, Mental/Behavioral Health diagnosis, substance abuse/misuse and whose Primary Care Provider is a [facility] provider or ACO contracted building control surveyor in [place]).  Does not meet Complex Care Management program criteria.  Patient know longer has Medicaid and will follow up with PCP as needed.  [facility]  [facility], [facility] VBCI Assistant Direct Dial: [phone]  Fax: [phone]

## 2024-09-02 ENCOUNTER — Ambulatory Visit: Payer: Self-pay | Admitting: Family Medicine
